# Patient Record
Sex: Female | Born: 1980 | Race: White | Hispanic: No | Marital: Married | Smoking: Never smoker
Health system: Southern US, Community
[De-identification: ages and names within clinical notes are randomized; demographics above are authoritative.]

## PROBLEM LIST (undated history)

## (undated) ENCOUNTER — Inpatient Hospital Stay (HOSPITAL_COMMUNITY): Payer: Self-pay

## (undated) DIAGNOSIS — S0990XS Unspecified injury of head, sequela: Secondary | ICD-10-CM

## (undated) DIAGNOSIS — H9190 Unspecified hearing loss, unspecified ear: Secondary | ICD-10-CM

## (undated) DIAGNOSIS — H547 Unspecified visual loss: Secondary | ICD-10-CM

## (undated) HISTORY — DX: Unspecified visual loss: H54.7

## (undated) HISTORY — PX: WISDOM TOOTH EXTRACTION: SHX21

## (undated) HISTORY — DX: Unspecified hearing loss, unspecified ear: H91.90

## (undated) HISTORY — DX: Unspecified injury of head, sequela: S09.90XS

---

## 2013-02-03 ENCOUNTER — Other Ambulatory Visit: Payer: Self-pay | Admitting: Advanced Practice Midwife

## 2013-02-03 ENCOUNTER — Ambulatory Visit (INDEPENDENT_AMBULATORY_CARE_PROVIDER_SITE_OTHER): Payer: BC Managed Care – PPO | Admitting: Advanced Practice Midwife

## 2013-02-03 ENCOUNTER — Encounter: Payer: Self-pay | Admitting: Advanced Practice Midwife

## 2013-02-03 VITALS — BP 121/80 | Ht 67.0 in | Wt 200.0 lb

## 2013-02-03 DIAGNOSIS — Z124 Encounter for screening for malignant neoplasm of cervix: Secondary | ICD-10-CM

## 2013-02-03 DIAGNOSIS — Z113 Encounter for screening for infections with a predominantly sexual mode of transmission: Secondary | ICD-10-CM

## 2013-02-03 DIAGNOSIS — Z3490 Encounter for supervision of normal pregnancy, unspecified, unspecified trimester: Secondary | ICD-10-CM

## 2013-02-03 DIAGNOSIS — Z3401 Encounter for supervision of normal first pregnancy, first trimester: Secondary | ICD-10-CM | POA: Insufficient documentation

## 2013-02-03 DIAGNOSIS — Z1151 Encounter for screening for human papillomavirus (HPV): Secondary | ICD-10-CM

## 2013-02-03 DIAGNOSIS — Z3400 Encounter for supervision of normal first pregnancy, unspecified trimester: Secondary | ICD-10-CM

## 2013-02-03 NOTE — Patient Instructions (Signed)
Cell-free DNA  Pregnancy - First Trimester During sexual intercourse, millions of sperm go into the vagina. Only 1 sperm will penetrate and fertilize the female egg while it is in the Fallopian tube. One week later, the fertilized egg implants into the wall of the uterus. An embryo begins to develop into a baby. At 6 to 8 weeks, the eyes and face are formed and the heartbeat can be seen on ultrasound. At the end of 12 weeks (first trimester), all the baby's organs are formed. Now that you are pregnant, you will want to do everything you can to have a healthy baby. Two of the most important things are to get good prenatal care and follow your caregiver's instructions. Prenatal care is all the medical care you receive before the baby's birth. It is given to prevent, find, and treat problems during the pregnancy and childbirth. PRENATAL EXAMS  During prenatal visits, your weight, blood pressure, and urine are checked. This is done to make sure you are healthy and progressing normally during the pregnancy.  A pregnant woman should gain 25 to 35 pounds during the pregnancy. However, if you are overweight or underweight, your caregiver will advise you regarding your weight.  Your caregiver will ask and answer questions for you.  Blood work, cervical cultures, other necessary tests, and a Pap test are done during your prenatal exams. These tests are done to check on your health and the probable health of your baby. Tests are strongly recommended and done for HIV with your permission. This is the virus that causes AIDS. These tests are done because medicines can be given to help prevent your baby from being born with this infection should you have been infected without knowing it. Blood work is also used to find out your blood type, previous infections, and follow your blood levels (hemoglobin).  Low hemoglobin (anemia) is common during pregnancy. Iron and vitamins are given to help prevent this. Later in the  pregnancy, blood tests for diabetes will be done along with any other tests if any problems develop.  You may need other tests to make sure you and the baby are doing well. CHANGES DURING THE FIRST TRIMESTER  Your body goes through many changes during pregnancy. They vary from person to person. Talk to your caregiver about changes you notice and are concerned about. Changes can include:  Your menstrual period stops.  The egg and sperm carry the genes that determine what you look like. Genes from you and your partner are forming a baby. The female genes determine whether the baby is a boy or a girl.  Your body increases in girth and you may feel bloated.  Feeling sick to your stomach (nauseous) and throwing up (vomiting). If the vomiting is uncontrollable, call your caregiver.  Your breasts will begin to enlarge and become tender.  Your nipples may stick out more and become darker.  The need to urinate more. Painful urination may mean you have a bladder infection.  Tiring easily.  Loss of appetite.  Cravings for certain kinds of food.  At first, you may gain or lose a couple of pounds.  You may have changes in your emotions from day to day (excited to be pregnant or concerned something may go wrong with the pregnancy and baby).  You may have more vivid and strange dreams. HOME CARE INSTRUCTIONS   It is very important to avoid all smoking, alcohol and non-prescribed drugs during your pregnancy. These affect the formation and growth  of the baby. Avoid chemicals while pregnant to ensure the delivery of a healthy infant.  Start your prenatal visits by the 12th week of pregnancy. They are usually scheduled monthly at first, then more often in the last 2 months before delivery. Keep your caregiver's appointments. Follow your caregiver's instructions regarding medicine use, blood and lab tests, exercise, and diet.  During pregnancy, you are providing food for you and your baby. Eat  regular, well-balanced meals. Choose foods such as meat, fish, milk and other low fat dairy products, vegetables, fruits, and whole-grain breads and cereals. Your caregiver will tell you of the ideal weight gain.  You can help morning sickness by keeping soda crackers at the bedside. Eat a couple before arising in the morning. You may want to use the crackers without salt on them.  Eating 4 to 5 small meals rather than 3 large meals a day also may help the nausea and vomiting.  Drinking liquids between meals instead of during meals also seems to help nausea and vomiting.  A physical sexual relationship may be continued throughout pregnancy if there are no other problems. Problems may be early (premature) leaking of amniotic fluid from the membranes, vaginal bleeding, or belly (abdominal) pain.  Exercise regularly if there are no restrictions. Check with your caregiver or physical therapist if you are unsure of the safety of some of your exercises. Greater weight gain will occur in the last 2 trimesters of pregnancy. Exercising will help:  Control your weight.  Keep you in shape.  Prepare you for labor and delivery.  Help you lose your pregnancy weight after you deliver your baby.  Wear a good support or jogging bra for breast tenderness during pregnancy. This may help if worn during sleep too.  Ask when prenatal classes are available. Begin classes when they are offered.  Do not use hot tubs, steam rooms, or saunas.  Wear your seat belt when driving. This protects you and your baby if you are in an accident.  Avoid raw meat, uncooked cheese, cat litter boxes, and soil used by cats throughout the pregnancy. These carry germs that can cause birth defects in the baby.  The first trimester is a good time to visit your dentist for your dental health. Getting your teeth cleaned is okay. Use a softer toothbrush and brush gently during pregnancy.  Ask for help if you have financial,  counseling, or nutritional needs during pregnancy. Your caregiver will be able to offer counseling for these needs as well as refer you for other special needs.  Do not take any medicines or herbs unless told by your caregiver.  Inform your caregiver if there is any mental or physical domestic violence.  Make a list of emergency phone numbers of family, friends, hospital, and police and fire departments.  Write down your questions. Take them to your prenatal visit.  Do not douche.  Do not cross your legs.  If you have to stand for long periods of time, rotate you feet or take small steps in a circle.  You may have more vaginal secretions that may require a sanitary pad. Do not use tampons or scented sanitary pads. MEDICINES AND DRUG USE IN PREGNANCY  Take prenatal vitamins as directed. The vitamin should contain 1 milligram of folic acid. Keep all vitamins out of reach of children. Only a couple vitamins or tablets containing iron may be fatal to a baby or young child when ingested.  Avoid use of all medicines, including herbs,  over-the-counter medicines, not prescribed or suggested by your caregiver. Only take over-the-counter or prescription medicines for pain, discomfort, or fever as directed by your caregiver. Do not use aspirin, ibuprofen, or naproxen unless directed by your caregiver.  Let your caregiver also know about herbs you may be using.  Alcohol is related to a number of birth defects. This includes fetal alcohol syndrome. All alcohol, in any form, should be avoided completely. Smoking will cause low birth rate and premature babies.  Street or illegal drugs are very harmful to the baby. They are absolutely forbidden. A baby born to an addicted mother will be addicted at birth. The baby will go through the same withdrawal an adult does.  Let your caregiver know about any medicines that you have to take and for what reason you take them. SEEK MEDICAL CARE IF:  You have any  concerns or worries during your pregnancy. It is better to call with your questions if you feel they cannot wait, rather than worry about them. SEEK IMMEDIATE MEDICAL CARE IF:   An unexplained oral temperature above 102 F (38.9 C) develops, or as your caregiver suggests.  You have leaking of fluid from the vagina (birth canal). If leaking membranes are suspected, take your temperature and inform your caregiver of this when you call.  There is vaginal spotting or bleeding. Notify your caregiver of the amount and how many pads are used.  You develop a bad smelling vaginal discharge with a change in the color.  You continue to feel sick to your stomach (nauseated) and have no relief from remedies suggested. You vomit blood or coffee ground-like materials.  You lose more than 2 pounds of weight in 1 week.  You gain more than 2 pounds of weight in 1 week and you notice swelling of your face, hands, feet, or legs.  You gain 5 pounds or more in 1 week (even if you do not have swelling of your hands, face, legs, or feet).  You get exposed to [place] measles and have never had them.  You are exposed to fifth disease or chickenpox.  You develop belly (abdominal) pain. Round ligament discomfort is a common non-cancerous (benign) cause of abdominal pain in pregnancy. Your caregiver still must evaluate this.  You develop headache, fever, diarrhea, pain with urination, or shortness of breath.  You fall or are in a car accident or have any kind of trauma.  There is mental or physical violence in your home. Document Released: 03/10/2001 Document Revised: 12/09/2011 Document Reviewed: 09/11/2008 [facility] [facility] [employer] Patient Information 2014 [place], [place].

## 2013-02-03 NOTE — Progress Notes (Signed)
P= 80 

## 2013-02-03 NOTE — Progress Notes (Signed)
p-88  Last pap 2 years ago.  IUP with FHT 169  CRL 18.63mm  GA [redacted]w[redacted]d

## 2013-02-03 NOTE — Progress Notes (Signed)
  Subjective:    Teresa Rojas is being seen today for her first obstetrical visit.  This is a planned pregnancy. She is at [redacted]w[redacted]d gestation by LMP verified by informal BS [place] today. Pos cardiac activity. (See separate note.). Her obstetrical history is significant for none. Relationship with FOB: spouse, living together. Patient does intend to breast feed. Pregnancy history fully reviewed.  Patient reports no bleeding and no cramping.  Review of Systems:   Review of Systems: Breast tenderness and fullness.   Objective:     BP 121/80  Ht 5\' 7"  (1.702 m)  Wt 200 lb (90.719 kg)  BMI 31.32 kg/m2  LMP 12/03/2012 Physical Exam  Exam: General appearance - alert, well appearing, and in no distress, oriented to person, place, and time and overweight Chest - clear to auscultation, no wheezes, rales or rhonchi, symmetric air entry Heart - normal rate, regular rhythm, normal S1, S2, no murmurs, rubs, clicks or gallops Abdomen - soft, nontender, nondistended, no masses or organomegaly no CVA tenderness Breasts - patient declines to have breast exam. Normal exam < 1 year ago. Normal to visual inspection. Pelvic - normal external genitalia, vulva, vagina, cervix, uterus and adnexa, CERVIX: normal appearing cervix without discharge or lesions, nulliparous os, anterior, Uterus slightly enlarged, retroverted.PAP: Pap smear done today, exam chaperoned. Extremities - no pedal edema noted, Homan's sign negative bilaterally    Assessment:    Pregnancy: G1P0 Pregnancy - Plan: Culture, OB Urine, Prescript Monitor Profile(19), Alcohol metabolite (ETG), urine, HIV antibody, Obstetric panel, CANCELED: Cytology - PAP, CANCELED: Obstetric panel, CANCELED: HIV antibody, CANCELED: GC/Chlamydia Probe Amp  Supervision of normal first pregnancy in first trimester - Plan: Cytology - PAP  Plan:     Initial labs drawn. Prenatal vitamins. Problem list reviewed and updated. AFP3 discussed: undecided. CF  undecided.  Role of ultrasound in pregnancy discussed; fetal survey: requested. Amniocentesis discussed: not indicated. Follow up in 4 weeks. 50% of 30 min visit spent on counseling and coordination of care.  Dorathy Kinsman 02/03/2013

## 2013-02-04 LAB — OBSTETRIC PANEL
Antibody Screen: NEGATIVE
Basophils Absolute: 0 10*3/uL (ref 0.0–0.1)
Basophils Relative: 0 % (ref 0–1)
Eosinophils Absolute: 0.1 10*3/uL (ref 0.0–0.7)
HCT: 37.8 % (ref 36.0–46.0)
Hemoglobin: 12.7 g/dL (ref 12.0–15.0)
MCH: 29.7 pg (ref 26.0–34.0)
MCHC: 33.6 g/dL (ref 30.0–36.0)
Monocytes Absolute: 0.4 10*3/uL (ref 0.1–1.0)
Monocytes Relative: 6 % (ref 3–12)
Neutro Abs: 5 10*3/uL (ref 1.7–7.7)
RDW: 13.7 % (ref 11.5–15.5)
Rh Type: POSITIVE
WBC: 7.4 10*3/uL (ref 4.0–10.5)

## 2013-02-04 LAB — PRESCRIPTION MONITORING PROFILE (19 PANEL)
Amphetamine/Meth: NEGATIVE ng/mL
Buprenorphine, Urine: NEGATIVE ng/mL
Carisoprodol, Urine: NEGATIVE ng/mL
Fentanyl, Ur: NEGATIVE ng/mL
Methadone Screen, Urine: NEGATIVE ng/mL
Nitrites, Initial: NEGATIVE ug/mL
Oxycodone Screen, Ur: NEGATIVE ng/mL
Propoxyphene: NEGATIVE ng/mL
Tapentadol, urine: NEGATIVE ng/mL

## 2013-02-04 LAB — GC/CHLAMYDIA PROBE AMP: GC Probe RNA: NEGATIVE

## 2013-02-04 LAB — CULTURE, OB URINE: Colony Count: NO GROWTH

## 2013-02-04 LAB — HIV ANTIBODY (ROUTINE TESTING W REFLEX): HIV: NONREACTIVE

## 2013-02-07 ENCOUNTER — Encounter: Payer: Self-pay | Admitting: Advanced Practice Midwife

## 2013-02-07 DIAGNOSIS — O9989 Other specified diseases and conditions complicating pregnancy, childbirth and the puerperium: Secondary | ICD-10-CM

## 2013-02-07 DIAGNOSIS — O09899 Supervision of other high risk pregnancies, unspecified trimester: Secondary | ICD-10-CM | POA: Insufficient documentation

## 2013-02-07 DIAGNOSIS — Z283 Underimmunization status: Secondary | ICD-10-CM | POA: Insufficient documentation

## 2013-02-07 DIAGNOSIS — Z2839 Other underimmunization status: Secondary | ICD-10-CM | POA: Insufficient documentation

## 2013-03-03 ENCOUNTER — Ambulatory Visit (INDEPENDENT_AMBULATORY_CARE_PROVIDER_SITE_OTHER): Payer: BC Managed Care – PPO | Admitting: Family

## 2013-03-03 VITALS — BP 110/69 | Wt 198.0 lb

## 2013-03-03 DIAGNOSIS — Z3401 Encounter for supervision of normal first pregnancy, first trimester: Secondary | ICD-10-CM

## 2013-03-03 DIAGNOSIS — Z3400 Encounter for supervision of normal first pregnancy, unspecified trimester: Secondary | ICD-10-CM

## 2013-03-03 NOTE — Progress Notes (Signed)
p-92 

## 2013-03-03 NOTE — Progress Notes (Signed)
Reviewed OB labs with patient and husband Thomasena Edis).  FHR obtained by bedside US Mervyn Gay).  Pt declines genetic testing.  Will schedule detailed anatomy ultrasound at 19 wks.

## 2013-03-30 NOTE — L&D Delivery Note (Signed)
Delivery Note Called to delivery. At 8:16 PM a viable female was delivered via Vaginal, Spontaneous Delivery (Presentation: Left Occiput Anterior).  Infant delivered to maternal abdomen. APGAR: 9, 9.  Cord: clamped x 2 and cut. Active management of 3rd stage with traction and Pitocin. Placenta delivered intact with 3v cord, status: Intact, Spontaneous. with the following complications: none.  Cord pH: n/a.   Anesthesia: Epidural  Episiotomy: None Lacerations: 2nd degree sulcus tears x2 and 1st degree perineal repaired.  Small periurethral tears x2 hemostatic not repaired Suture Repair: 3.0 Vicryl and 3.0 Vicryl rapide   Est. Blood Loss (mL): 350  Mom to postpartum.  Baby to Couplet care / Skin to Skin.  [name], Buffie 09/10/2013, 9:35 PM  I was present for this delivery and agree with the above resident's note.   Sharen CounterLisa Leftwich-Kirby Certified Nurse-Midwife

## 2013-03-30 NOTE — L&D Delivery Note (Signed)
`````  Attestation of Attending Supervision of Advanced Practitioner: Evaluation and management procedures were performed by the PA/NP/CNM/OB Fellow under my supervision/collaboration. Chart reviewed and agree with management and plan.  Mcihael Hinderman V 09/14/2013 1:31 PM

## 2013-04-04 ENCOUNTER — Encounter: Payer: Self-pay | Admitting: Advanced Practice Midwife

## 2013-04-04 ENCOUNTER — Ambulatory Visit (INDEPENDENT_AMBULATORY_CARE_PROVIDER_SITE_OTHER): Payer: BC Managed Care – PPO | Admitting: Obstetrics & Gynecology

## 2013-04-04 ENCOUNTER — Encounter: Payer: Self-pay | Admitting: Obstetrics & Gynecology

## 2013-04-04 VITALS — BP 108/74 | Wt 204.0 lb

## 2013-04-04 DIAGNOSIS — Z3400 Encounter for supervision of normal first pregnancy, unspecified trimester: Secondary | ICD-10-CM

## 2013-04-04 DIAGNOSIS — Z3401 Encounter for supervision of normal first pregnancy, first trimester: Secondary | ICD-10-CM

## 2013-04-04 NOTE — Progress Notes (Signed)
No problems.  Refused genetic screening.  Discussed weight gain goal of 12-20 pounds.  Anatomy US ordered.

## 2013-04-04 NOTE — Progress Notes (Signed)
P-89 

## 2013-04-06 ENCOUNTER — Encounter: Payer: BC Managed Care – PPO | Admitting: Obstetrics & Gynecology

## 2013-04-20 ENCOUNTER — Other Ambulatory Visit: Payer: Self-pay | Admitting: Obstetrics & Gynecology

## 2013-04-20 ENCOUNTER — Ambulatory Visit (HOSPITAL_COMMUNITY)
Admission: RE | Admit: 2013-04-20 | Discharge: 2013-04-20 | Disposition: A | Discharge status: Home / Self Care | Payer: BC Managed Care – PPO | Source: Ambulatory Visit | Attending: Obstetrics & Gynecology | Admitting: Obstetrics & Gynecology

## 2013-04-20 DIAGNOSIS — Z3401 Encounter for supervision of normal first pregnancy, first trimester: Secondary | ICD-10-CM

## 2013-04-20 DIAGNOSIS — Z3689 Encounter for other specified antenatal screening: Secondary | ICD-10-CM | POA: Insufficient documentation

## 2013-04-26 ENCOUNTER — Encounter: Payer: Self-pay | Admitting: Obstetrics & Gynecology

## 2013-05-02 ENCOUNTER — Ambulatory Visit (INDEPENDENT_AMBULATORY_CARE_PROVIDER_SITE_OTHER): Payer: BC Managed Care – PPO | Admitting: Obstetrics & Gynecology

## 2013-05-02 VITALS — BP 112/72 | Wt 210.0 lb

## 2013-05-02 DIAGNOSIS — Z3400 Encounter for supervision of normal first pregnancy, unspecified trimester: Secondary | ICD-10-CM

## 2013-05-02 DIAGNOSIS — Z3401 Encounter for supervision of normal first pregnancy, first trimester: Secondary | ICD-10-CM

## 2013-05-02 NOTE — Progress Notes (Signed)
P-73 

## 2013-05-02 NOTE — Progress Notes (Signed)
F/U US for heart and placenta.  No problems.  Wants Tdap at 28 weeks

## 2013-06-01 ENCOUNTER — Ambulatory Visit (INDEPENDENT_AMBULATORY_CARE_PROVIDER_SITE_OTHER): Payer: BC Managed Care – PPO | Admitting: Obstetrics & Gynecology

## 2013-06-01 ENCOUNTER — Encounter: Payer: Self-pay | Admitting: Obstetrics & Gynecology

## 2013-06-01 ENCOUNTER — Other Ambulatory Visit: Payer: Self-pay | Admitting: Obstetrics & Gynecology

## 2013-06-01 ENCOUNTER — Ambulatory Visit (HOSPITAL_COMMUNITY)
Admission: RE | Admit: 2013-06-01 | Discharge: 2013-06-01 | Disposition: A | Discharge status: Home / Self Care | Payer: BC Managed Care – PPO | Source: Ambulatory Visit | Attending: Obstetrics & Gynecology | Admitting: Obstetrics & Gynecology

## 2013-06-01 VITALS — BP 106/71 | Wt 214.0 lb

## 2013-06-01 DIAGNOSIS — Z3400 Encounter for supervision of normal first pregnancy, unspecified trimester: Secondary | ICD-10-CM

## 2013-06-01 DIAGNOSIS — O4440 Low lying placenta NOS or without hemorrhage, unspecified trimester: Secondary | ICD-10-CM | POA: Insufficient documentation

## 2013-06-01 DIAGNOSIS — Z3401 Encounter for supervision of normal first pregnancy, first trimester: Secondary | ICD-10-CM

## 2013-06-01 DIAGNOSIS — Z3689 Encounter for other specified antenatal screening: Secondary | ICD-10-CM | POA: Insufficient documentation

## 2013-06-01 DIAGNOSIS — O4410 Placenta previa with hemorrhage, unspecified trimester: Secondary | ICD-10-CM

## 2013-06-01 DIAGNOSIS — O4400 Placenta previa specified as without hemorrhage, unspecified trimester: Secondary | ICD-10-CM | POA: Insufficient documentation

## 2013-06-01 LAB — GLUCOSE, POCT (MANUAL RESULT ENTRY): POC GLUCOSE: 67 mg/dL — AB (ref 70–99)

## 2013-06-01 NOTE — Progress Notes (Signed)
P = 76 

## 2013-06-01 NOTE — Progress Notes (Signed)
Routine visit. Good FM. RBS today here is 67. Glucola, labs, TDAP at next visit. No problems. Rescan for placental location later (1.1 cm from os).

## 2013-06-02 ENCOUNTER — Encounter: Payer: Self-pay | Admitting: Obstetrics & Gynecology

## 2013-06-23 ENCOUNTER — Ambulatory Visit (INDEPENDENT_AMBULATORY_CARE_PROVIDER_SITE_OTHER): Payer: BC Managed Care – PPO | Admitting: Advanced Practice Midwife

## 2013-06-23 ENCOUNTER — Encounter: Payer: Self-pay | Admitting: Advanced Practice Midwife

## 2013-06-23 VITALS — BP 125/71 | Wt 216.0 lb

## 2013-06-23 DIAGNOSIS — Z3480 Encounter for supervision of other normal pregnancy, unspecified trimester: Secondary | ICD-10-CM

## 2013-06-23 LAB — CBC
HEMATOCRIT: 30.7 % — AB (ref 36.0–46.0)
Hemoglobin: 10.6 g/dL — ABNORMAL LOW (ref 12.0–15.0)
MCH: 30 pg (ref 26.0–34.0)
MCHC: 34.5 g/dL (ref 30.0–36.0)
MCV: 87 fL (ref 78.0–100.0)
Platelets: 294 10*3/uL (ref 150–400)
RBC: 3.53 MIL/uL — ABNORMAL LOW (ref 3.87–5.11)
RDW: 13.4 % (ref 11.5–15.5)
WBC: 6.6 10*3/uL (ref 4.0–10.5)

## 2013-06-23 NOTE — Progress Notes (Signed)
Doing well.  Good FM . Discussed low lying placenta. Plan US at 34 weeks to assess position of placenta

## 2013-06-23 NOTE — Progress Notes (Signed)
p=101 

## 2013-06-23 NOTE — Patient Instructions (Signed)
Third Trimester of Pregnancy  The third trimester is from week 29 through week 42, months 7 through 9. The third trimester is a time when the fetus is growing rapidly. At the end of the ninth month, the fetus is about 20 inches in length and weighs 6 10 pounds.   BODY CHANGES  Your body goes through many changes during pregnancy. The changes vary from woman to woman.    Your weight will continue to increase. You can expect to gain 25 35 pounds (11 16 kg) by the end of the pregnancy.   You may begin to get stretch marks on your hips, abdomen, and breasts.   You may urinate more often because the fetus is moving lower into your pelvis and pressing on your bladder.   You may develop or continue to have heartburn as a result of your pregnancy.   You may develop constipation because certain hormones are causing the muscles that push waste through your intestines to slow down.   You may develop hemorrhoids or swollen, bulging veins (varicose veins).   You may have pelvic pain because of the weight gain and pregnancy hormones relaxing your joints between the bones in your pelvis. Back aches may result from over exertion of the muscles supporting your posture.   Your breasts will continue to grow and be tender. A yellow discharge may leak from your breasts called colostrum.   Your belly button may stick out.   You may feel short of breath because of your expanding uterus.   You may notice the fetus "dropping," or moving lower in your abdomen.   You may have a bloody mucus discharge. This usually occurs a few days to a week before labor begins.   Your cervix becomes thin and soft (effaced) near your due date.  WHAT TO EXPECT AT YOUR PRENATAL EXAMS   You will have prenatal exams every 2 weeks until week 36. Then, you will have weekly prenatal exams. During a routine prenatal visit:   You will be weighed to make sure you and the fetus are growing normally.   Your blood pressure is taken.   Your abdomen will be  measured to track your baby's growth.   The fetal heartbeat will be listened to.   Any test results from the previous visit will be discussed.   You may have a cervical check near your due date to see if you have effaced.  At around 36 weeks, your caregiver will check your cervix. At the same time, your caregiver will also perform a test on the secretions of the vaginal tissue. This test is to determine if a type of bacteria, Group B streptococcus, is present. Your caregiver will explain this further.  Your caregiver may ask you:   What your birth plan is.   How you are feeling.   If you are feeling the baby move.   If you have had any abnormal symptoms, such as leaking fluid, bleeding, severe headaches, or abdominal cramping.   If you have any questions.  Other tests or screenings that may be performed during your third trimester include:   Blood tests that check for low iron levels (anemia).   Fetal testing to check the health, activity level, and growth of the fetus. Testing is done if you have certain medical conditions or if there are problems during the pregnancy.  FALSE LABOR  You may feel small, irregular contractions that eventually go away. These are called Braxton Hicks contractions, or   false labor. Contractions may last for hours, days, or even weeks before true labor sets in. If contractions come at regular intervals, intensify, or become painful, it is best to be seen by your caregiver.   SIGNS OF LABOR    Menstrual-like cramps.   Contractions that are 5 minutes apart or less.   Contractions that start on the top of the uterus and spread down to the lower abdomen and back.   A sense of increased pelvic pressure or back pain.   A watery or bloody mucus discharge that comes from the vagina.  If you have any of these signs before the 37th week of pregnancy, call your caregiver right away. You need to go to the hospital to get checked immediately.  HOME CARE INSTRUCTIONS    Avoid all  smoking, herbs, alcohol, and unprescribed drugs. These chemicals affect the formation and growth of the baby.   Follow your caregiver's instructions regarding medicine use. There are medicines that are either safe or unsafe to take during pregnancy.   Exercise only as directed by your caregiver. Experiencing uterine cramps is a good sign to stop exercising.   Continue to eat regular, healthy meals.   Wear a good support bra for breast tenderness.   Do not use hot tubs, steam rooms, or saunas.   Wear your seat belt at all times when driving.   Avoid raw meat, uncooked cheese, cat litter boxes, and soil used by cats. These carry germs that can cause birth defects in the baby.   Take your prenatal vitamins.   Try taking a stool softener (if your caregiver approves) if you develop constipation. Eat more high-fiber foods, such as fresh vegetables or fruit and whole grains. Drink plenty of fluids to keep your urine clear or pale yellow.   Take warm sitz baths to soothe any pain or discomfort caused by hemorrhoids. Use hemorrhoid cream if your caregiver approves.   If you develop varicose veins, wear support hose. Elevate your feet for 15 minutes, 3 4 times a day. Limit salt in your diet.   Avoid heavy lifting, wear low heal shoes, and practice good posture.   Rest a lot with your legs elevated if you have leg cramps or low back pain.   Visit your dentist if you have not gone during your pregnancy. Use a soft toothbrush to brush your teeth and be gentle when you floss.   A sexual relationship may be continued unless your caregiver directs you otherwise.   Do not travel far distances unless it is absolutely necessary and only with the approval of your caregiver.   Take prenatal classes to understand, practice, and ask questions about the labor and delivery.   Make a trial run to the hospital.   Pack your hospital bag.   Prepare the baby's nursery.   Continue to go to all your prenatal visits as directed  by your caregiver.  SEEK MEDICAL CARE IF:   You are unsure if you are in labor or if your water has broken.   You have dizziness.   You have mild pelvic cramps, pelvic pressure, or nagging pain in your abdominal area.   You have persistent nausea, vomiting, or diarrhea.   You have a bad smelling vaginal discharge.   You have pain with urination.  SEEK IMMEDIATE MEDICAL CARE IF:    You have a fever.   You are leaking fluid from your vagina.   You have spotting or bleeding from your vagina.     You have severe abdominal cramping or pain.   You have rapid weight loss or gain.   You have shortness of breath with chest pain.   You notice sudden or extreme swelling of your face, hands, ankles, feet, or legs.   You have not felt your baby move in over an hour.   You have severe headaches that do not go away with medicine.   You have vision changes.  Document Released: 03/10/2001 Document Revised: 11/16/2012 Document Reviewed: 05/17/2012  [employer] Patient Information 2014 [employer].

## 2013-06-24 LAB — RPR

## 2013-06-24 LAB — GLUCOSE TOLERANCE, 1 HOUR (50G) W/O FASTING: Glucose, 1 Hour GTT: 107 mg/dL (ref 70–140)

## 2013-06-24 LAB — HIV ANTIBODY (ROUTINE TESTING W REFLEX): HIV: NONREACTIVE

## 2013-06-26 ENCOUNTER — Telehealth: Payer: Self-pay | Admitting: *Deleted

## 2013-06-26 NOTE — Telephone Encounter (Signed)
Pt notified of normal 1 hr GTT. 

## 2013-06-30 ENCOUNTER — Encounter: Payer: Self-pay | Admitting: Advanced Practice Midwife

## 2013-07-05 ENCOUNTER — Ambulatory Visit (INDEPENDENT_AMBULATORY_CARE_PROVIDER_SITE_OTHER): Payer: BC Managed Care – PPO | Admitting: Family Medicine

## 2013-07-05 ENCOUNTER — Encounter: Payer: Self-pay | Admitting: Family Medicine

## 2013-07-05 VITALS — BP 115/76 | Wt 219.0 lb

## 2013-07-05 DIAGNOSIS — Z2839 Other underimmunization status: Secondary | ICD-10-CM

## 2013-07-05 DIAGNOSIS — O4410 Placenta previa with hemorrhage, unspecified trimester: Secondary | ICD-10-CM

## 2013-07-05 DIAGNOSIS — Z3400 Encounter for supervision of normal first pregnancy, unspecified trimester: Secondary | ICD-10-CM

## 2013-07-05 DIAGNOSIS — Z283 Underimmunization status: Secondary | ICD-10-CM

## 2013-07-05 DIAGNOSIS — O4440 Low lying placenta NOS or without hemorrhage, unspecified trimester: Secondary | ICD-10-CM

## 2013-07-05 DIAGNOSIS — O9989 Other specified diseases and conditions complicating pregnancy, childbirth and the puerperium: Secondary | ICD-10-CM

## 2013-07-05 DIAGNOSIS — Z23 Encounter for immunization: Secondary | ICD-10-CM

## 2013-07-05 DIAGNOSIS — Z3401 Encounter for supervision of normal first pregnancy, first trimester: Secondary | ICD-10-CM

## 2013-07-05 DIAGNOSIS — O09899 Supervision of other high risk pregnancies, unspecified trimester: Secondary | ICD-10-CM

## 2013-07-05 MED ORDER — TETANUS-DIPHTH-ACELL PERTUSSIS 5-2.5-18.5 LF-MCG/0.5 IM SUSP: 0.5000 mL | Freq: Once | INTRAMUSCULAR | Status: DC

## 2013-07-05 NOTE — Progress Notes (Signed)
p-93 

## 2013-07-05 NOTE — Progress Notes (Signed)
U/S scheduled for evaluation of placenta Nml 28 wk labs Discussed addition of iron rich foods

## 2013-07-05 NOTE — Patient Instructions (Signed)
Third Trimester of Pregnancy The third trimester is from week 29 through week 42, months 7 through 9. The third trimester is a time when the fetus is growing rapidly. At the end of the ninth month, the fetus is about 20 inches in length and weighs 6 10 pounds.  BODY CHANGES Your body goes through many changes during pregnancy. The changes vary from woman to woman.   Your weight will continue to increase. You can expect to gain 25 35 pounds (11 16 kg) by the end of the pregnancy.  You may begin to get stretch marks on your hips, abdomen, and breasts.  You may urinate more often because the fetus is moving lower into your pelvis and pressing on your bladder.  You may develop or continue to have heartburn as a result of your pregnancy.  You may develop constipation because certain hormones are causing the muscles that push waste through your intestines to slow down.  You may develop hemorrhoids or swollen, bulging veins (varicose veins).  You may have pelvic pain because of the weight gain and pregnancy hormones relaxing your joints between the bones in your pelvis. Back aches may result from over exertion of the muscles supporting your posture.  Your breasts will continue to grow and be tender. A yellow discharge may leak from your breasts called colostrum.  Your belly button may stick out.  You may feel short of breath because of your expanding uterus.  You may notice the fetus "dropping," or moving lower in your abdomen.  You may have a bloody mucus discharge. This usually occurs a few days to a week before labor begins.  Your cervix becomes thin and soft (effaced) near your due date. WHAT TO EXPECT AT YOUR PRENATAL EXAMS  You will have prenatal exams every 2 weeks until week 36. Then, you will have weekly prenatal exams. During a routine prenatal visit:  You will be weighed to make sure you and the fetus are growing normally.  Your blood pressure is taken.  Your abdomen will  be measured to track your baby's growth.  The fetal heartbeat will be listened to.  Any test results from the previous visit will be discussed.  You may have a cervical check near your due date to see if you have effaced. At around 36 weeks, your caregiver will check your cervix. At the same time, your caregiver will also perform a test on the secretions of the vaginal tissue. This test is to determine if a type of bacteria, Group B streptococcus, is present. Your caregiver will explain this further. Your caregiver may ask you:  What your birth plan is.  How you are feeling.  If you are feeling the baby move.  If you have had any abnormal symptoms, such as leaking fluid, bleeding, severe headaches, or abdominal cramping.  If you have any questions. Other tests or screenings that may be performed during your third trimester include:  Blood tests that check for low iron levels (anemia).  Fetal testing to check the health, activity level, and growth of the fetus. Testing is done if you have certain medical conditions or if there are problems during the pregnancy. FALSE LABOR You may feel small, irregular contractions that eventually go away. These are called Braxton Hicks contractions, or false labor. Contractions may last for hours, days, or even weeks before true labor sets in. If contractions come at regular intervals, intensify, or become painful, it is best to be seen by your caregiver.    SIGNS OF LABOR   Menstrual-like cramps.  Contractions that are 5 minutes apart or less.  Contractions that start on the top of the uterus and spread down to the lower abdomen and back.  A sense of increased pelvic pressure or back pain.  A watery or bloody mucus discharge that comes from the vagina. If you have any of these signs before the 37th week of pregnancy, call your caregiver right away. You need to go to the hospital to get checked immediately. HOME CARE INSTRUCTIONS   Avoid all  smoking, herbs, alcohol, and unprescribed drugs. These chemicals affect the formation and growth of the baby.  Follow your caregiver's instructions regarding medicine use. There are medicines that are either safe or unsafe to take during pregnancy.  Exercise only as directed by your caregiver. Experiencing uterine cramps is a good sign to stop exercising.  Continue to eat regular, healthy meals.  Wear a good support bra for breast tenderness.  Do not use hot tubs, steam rooms, or saunas.  Wear your seat belt at all times when driving.  Avoid raw meat, uncooked cheese, cat litter boxes, and soil used by cats. These carry germs that can cause birth defects in the baby.  Take your prenatal vitamins.  Try taking a stool softener (if your caregiver approves) if you develop constipation. Eat more high-fiber foods, such as fresh vegetables or fruit and whole grains. Drink plenty of fluids to keep your urine clear or pale yellow.  Take warm sitz baths to soothe any pain or discomfort caused by hemorrhoids. Use hemorrhoid cream if your caregiver approves.  If you develop varicose veins, wear support hose. Elevate your feet for 15 minutes, 3 4 times a day. Limit salt in your diet.  Avoid heavy lifting, wear low heal shoes, and practice good posture.  Rest a lot with your legs elevated if you have leg cramps or low back pain.  Visit your dentist if you have not gone during your pregnancy. Use a soft toothbrush to brush your teeth and be gentle when you floss.  A sexual relationship may be continued unless your caregiver directs you otherwise.  Do not travel far distances unless it is absolutely necessary and only with the approval of your caregiver.  Take prenatal classes to understand, practice, and ask questions about the labor and delivery.  Make a trial run to the hospital.  Pack your hospital bag.  Prepare the baby's nursery.  Continue to go to all your prenatal visits as directed  by your caregiver. SEEK MEDICAL CARE IF:  You are unsure if you are in labor or if your water has broken.  You have dizziness.  You have mild pelvic cramps, pelvic pressure, or nagging pain in your abdominal area.  You have persistent nausea, vomiting, or diarrhea.  You have a bad smelling vaginal discharge.  You have pain with urination. SEEK IMMEDIATE MEDICAL CARE IF:   You have a fever.  You are leaking fluid from your vagina.  You have spotting or bleeding from your vagina.  You have severe abdominal cramping or pain.  You have rapid weight loss or gain.  You have shortness of breath with chest pain.  You notice sudden or extreme swelling of your face, hands, ankles, feet, or legs.  You have not felt your baby move in over an hour.  You have severe headaches that do not go away with medicine.  You have vision changes. Document Released: 03/10/2001 Document Revised: 11/16/2012 Document Reviewed:   05/17/2012 [employer] Patient Information 2014 [employer].  Breastfeeding Deciding to breastfeed is one of the best choices you can make for you and your baby. A change in hormones during pregnancy causes your breast tissue to grow and increases the number and size of your milk ducts. These hormones also allow proteins, sugars, and fats from your blood supply to make breast milk in your milk-producing glands. Hormones prevent breast milk from being released before your baby is born as well as prompt milk flow after birth. Once breastfeeding has begun, thoughts of your baby, as well as his or her sucking or crying, can stimulate the release of milk from your milk-producing glands.  BENEFITS OF BREASTFEEDING For Your Baby  Your first milk (colostrum) helps your baby's digestive system function better.   There are antibodies in your milk that help your baby fight off infections.   Your baby has a lower incidence of asthma, allergies, and sudden infant death syndrome.    The nutrients in breast milk are better for your baby than infant formulas and are designed uniquely for your baby's needs.   Breast milk improves your baby's brain development.   Your baby is less likely to develop other conditions, such as childhood obesity, asthma, or type 2 diabetes mellitus.  For You   Breastfeeding helps to create a very special bond between you and your baby.   Breastfeeding is convenient. Breast milk is always available at the correct temperature and costs nothing.   Breastfeeding helps to burn calories and helps you lose the weight gained during pregnancy.   Breastfeeding makes your uterus contract to its prepregnancy size faster and slows bleeding (lochia) after you give birth.   Breastfeeding helps to lower your risk of developing type 2 diabetes mellitus, osteoporosis, and breast or ovarian cancer later in life. SIGNS THAT YOUR BABY IS HUNGRY Early Signs of Hunger  Increased alertness or activity.  Stretching.  Movement of the head from side to side.  Movement of the head and opening of the mouth when the corner of the mouth or cheek is stroked (rooting).  Increased sucking sounds, smacking lips, cooing, sighing, or squeaking.  Hand-to-mouth movements.  Increased sucking of fingers or hands. Late Signs of Hunger  Fussing.  Intermittent crying. Extreme Signs of Hunger Signs of extreme hunger will require calming and consoling before your baby will be able to breastfeed successfully. Do not wait for the following signs of extreme hunger to occur before you initiate breastfeeding:   Restlessness.  A loud, strong cry.   Screaming. BREASTFEEDING BASICS Breastfeeding Initiation  Find a comfortable place to sit or lie down, with your neck and back well supported.  Place a pillow or rolled up blanket under your baby to bring him or her to the level of your breast (if you are seated). Nursing pillows are specially designed to help  support your arms and your baby while you breastfeed.  Make sure that your baby's abdomen is facing your abdomen.   Gently massage your breast. With your fingertips, massage from your chest wall toward your nipple in a circular motion. This encourages milk flow. You may need to continue this action during the feeding if your milk flows slowly.  Support your breast with 4 fingers underneath and your thumb above your nipple. Make sure your fingers are well away from your nipple and your baby's mouth.   Stroke your baby's lips gently with your finger or nipple.   When your baby's mouth is   open wide enough, quickly bring your baby to your breast, placing your entire nipple and as much of the colored area around your nipple (areola) as possible into your baby's mouth.   More areola should be visible above your baby's upper lip than below the lower lip.   Your baby's tongue should be between his or her lower gum and your breast.   Ensure that your baby's mouth is correctly positioned around your nipple (latched). Your baby's lips should create a seal on your breast and be turned out (everted).  It is common for your baby to suck about 2 3 minutes in order to start the flow of breast milk. Latching Teaching your baby how to latch on to your breast properly is very important. An improper latch can cause nipple pain and decreased milk supply for you and poor weight gain in your baby. Also, if your baby is not latched onto your nipple properly, he or she may swallow some air during feeding. This can make your baby fussy. Burping your baby when you switch breasts during the feeding can help to get rid of the air. However, teaching your baby to latch on properly is still the best way to prevent fussiness from swallowing air while breastfeeding. Signs that your baby has successfully latched on to your nipple:    Silent tugging or silent sucking, without causing you pain.   Swallowing heard  between every 3 4 sucks.    Muscle movement above and in front of his or her ears while sucking.  Signs that your baby has not successfully latched on to nipple:   Sucking sounds or smacking sounds from your baby while breastfeeding.  Nipple pain. If you think your baby has not latched on correctly, slip your finger into the corner of your baby's mouth to break the suction and place it between your baby's gums. Attempt breastfeeding initiation again. Signs of Successful Breastfeeding Signs from your baby:   A gradual decrease in the number of sucks or complete cessation of sucking.   Falling asleep.   Relaxation of his or her body.   Retention of a small amount of milk in his or her mouth.   Letting go of your breast by himself or herself. Signs from you:  Breasts that have increased in firmness, weight, and size 1 3 hours after feeding.   Breasts that are softer immediately after breastfeeding.  Increased milk volume, as well as a change in milk consistency and color by the 5th day of breastfeeding.   Nipples that are not sore, cracked, or bleeding. Signs That Your Baby is Getting Enough Milk  Wetting at least 3 diapers in a 24-hour period. The urine should be clear and pale yellow by age 5 days.  At least 3 stools in a 24-hour period by age 5 days. The stool should be soft and yellow.  At least 3 stools in a 24-hour period by age 7 days. The stool should be seedy and yellow.  No loss of weight greater than 10% of birth weight during the first 3 days of age.  Average weight gain of 4 7 ounces (120 210 mL) per week after age 4 days.  Consistent daily weight gain by age 5 days, without weight loss after the age of 2 weeks. After a feeding, your baby may spit up a small amount. This is common. BREASTFEEDING FREQUENCY AND DURATION Frequent feeding will help you make more milk and can prevent sore nipples and breast engorgement.   Breastfeed when you feel the need to  reduce the fullness of your breasts or when your baby shows signs of hunger. This is called "breastfeeding on demand." Avoid introducing a pacifier to your baby while you are working to establish breastfeeding (the first 4 6 weeks after your baby is born). After this time you may choose to use a pacifier. Research has shown that pacifier use during the first year of a baby's life decreases the risk of sudden infant death syndrome (SIDS). Allow your baby to feed on each breast as long as he or she wants. Breastfeed until your baby is finished feeding. When your baby unlatches or falls asleep while feeding from the first breast, offer the second breast. Because newborns are often sleepy in the first few weeks of life, you may need to awaken your baby to get him or her to feed. Breastfeeding times will vary from baby to baby. However, the following rules can serve as a guide to help you ensure that your baby is properly fed:  Newborns (babies 4 weeks of age or younger) may breastfeed every 1 3 hours.  Newborns should not go longer than 3 hours during the day or 5 hours during the night without breastfeeding.  You should breastfeed your baby a minimum of 8 times in a 24-hour period until you begin to introduce solid foods to your baby at around 6 months of age. BREAST MILK PUMPING Pumping and storing breast milk allows you to ensure that your baby is exclusively fed your breast milk, even at times when you are unable to breastfeed. This is especially important if you are going back to work while you are still breastfeeding or when you are not able to be present during feedings. Your lactation consultant can give you guidelines on how long it is safe to store breast milk.  A breast pump is a machine that allows you to pump milk from your breast into a sterile bottle. The pumped breast milk can then be stored in a refrigerator or freezer. Some breast pumps are operated by hand, while others use electricity. Ask  your lactation consultant which type will work best for you. Breast pumps can be purchased, but some hospitals and breastfeeding support groups lease breast pumps on a monthly basis. A lactation consultant can teach you how to hand express breast milk, if you prefer not to use a pump.  CARING FOR YOUR BREASTS WHILE YOU BREASTFEED Nipples can become dry, cracked, and sore while breastfeeding. The following recommendations can help keep your breasts moisturized and healthy:  Avoid using soap on your nipples.   Wear a supportive bra. Although not required, special nursing bras and tank tops are designed to allow access to your breasts for breastfeeding without taking off your entire bra or top. Avoid wearing underwire style bras or extremely tight bras.  Air dry your nipples for 3 4minutes after each feeding.   Use only cotton bra pads to absorb leaked breast milk. Leaking of breast milk between feedings is normal.   Use lanolin on your nipples after breastfeeding. Lanolin helps to maintain your skin's normal moisture barrier. If you use pure lanolin you do not need to wash it off before feeding your baby again. Pure lanolin is not toxic to your baby. You may also hand express a few drops of breast milk and gently massage that milk into your nipples and allow the milk to air dry. In the first few weeks after giving birth, some women   experience extremely full breasts (engorgement). Engorgement can make your breasts feel heavy, warm, and tender to the touch. Engorgement peaks within 3 5 days after you give birth. The following recommendations can help ease engorgement:  Completely empty your breasts while breastfeeding or pumping. You may want to start by applying warm, moist heat (in the shower or with warm water-soaked hand towels) just before feeding or pumping. This increases circulation and helps the milk flow. If your baby does not completely empty your breasts while breastfeeding, pump any extra  milk after he or she is finished.  Wear a snug bra (nursing or regular) or tank top for 1 2 days to signal your body to slightly decrease milk production.  Apply ice packs to your breasts, unless this is too uncomfortable for you.  Make sure that your baby is latched on and positioned properly while breastfeeding. If engorgement persists after 48 hours of following these recommendations, contact your health care provider or a lactation consultant. OVERALL HEALTH CARE RECOMMENDATIONS WHILE BREASTFEEDING  Eat healthy foods. Alternate between meals and snacks, eating 3 of each per day. Because what you eat affects your breast milk, some of the foods may make your baby more irritable than usual. Avoid eating these foods if you are sure that they are negatively affecting your baby.  Drink milk, fruit juice, and water to satisfy your thirst (about 10 glasses a day).   Rest often, relax, and continue to take your prenatal vitamins to prevent fatigue, stress, and anemia.  Continue breast self-awareness checks.  Avoid chewing and smoking tobacco.  Avoid alcohol and drug use. Some medicines that may be harmful to your baby can pass through breast milk. It is important to ask your health care provider before taking any medicine, including all over-the-counter and prescription medicine as well as vitamin and herbal supplements. It is possible to become pregnant while breastfeeding. If birth control is desired, ask your health care provider about options that will be safe for your baby. SEEK MEDICAL CARE IF:   You feel like you want to stop breastfeeding or have become frustrated with breastfeeding.  You have painful breasts or nipples.  Your nipples are cracked or bleeding.  Your breasts are red, tender, or warm.  You have a swollen area on either breast.  You have a fever or chills.  You have nausea or vomiting.  You have drainage other than breast milk from your nipples.  Your breasts  do not become full before feedings by the 5th day after you give birth.  You feel sad and depressed.  Your baby is too sleepy to eat well.  Your baby is having trouble sleeping.   Your baby is wetting less than 3 diapers in a 24-hour period.  Your baby has less than 3 stools in a 24-hour period.  Your baby's skin or the white part of his or her eyes becomes yellow.   Your baby is not gaining weight by 5 days of age. SEEK IMMEDIATE MEDICAL CARE IF:   Your baby is overly tired (lethargic) and does not want to wake up and feed.  Your baby develops an unexplained fever. Document Released: 03/16/2005 Document Revised: 11/16/2012 Document Reviewed: 09/07/2012 [employer] Patient Information 2014 [employer].  

## 2013-07-19 ENCOUNTER — Encounter: Payer: Self-pay | Admitting: Family Medicine

## 2013-07-19 ENCOUNTER — Encounter: Payer: Self-pay | Admitting: Obstetrics & Gynecology

## 2013-07-19 ENCOUNTER — Ambulatory Visit (HOSPITAL_COMMUNITY)
Admission: RE | Admit: 2013-07-19 | Discharge: 2013-07-19 | Disposition: A | Discharge status: Home / Self Care | Payer: BC Managed Care – PPO | Source: Ambulatory Visit | Attending: Family Medicine | Admitting: Family Medicine

## 2013-07-19 ENCOUNTER — Other Ambulatory Visit: Payer: Self-pay | Admitting: Family Medicine

## 2013-07-19 ENCOUNTER — Ambulatory Visit (INDEPENDENT_AMBULATORY_CARE_PROVIDER_SITE_OTHER): Payer: BC Managed Care – PPO | Admitting: Obstetrics & Gynecology

## 2013-07-19 VITALS — BP 107/72 | HR 82 | Wt 220.0 lb

## 2013-07-19 DIAGNOSIS — O4410 Placenta previa with hemorrhage, unspecified trimester: Secondary | ICD-10-CM | POA: Insufficient documentation

## 2013-07-19 DIAGNOSIS — Z3401 Encounter for supervision of normal first pregnancy, first trimester: Secondary | ICD-10-CM

## 2013-07-19 DIAGNOSIS — Z3400 Encounter for supervision of normal first pregnancy, unspecified trimester: Secondary | ICD-10-CM

## 2013-07-19 DIAGNOSIS — O4440 Low lying placenta NOS or without hemorrhage, unspecified trimester: Secondary | ICD-10-CM

## 2013-07-19 NOTE — Progress Notes (Signed)
Routine visit. Good FM. No problems except Friday saw some liquid. SSE negative today. PROM/PTL  precautions reviewed. Her u/s for placenta location f/u is this morning.

## 2013-07-19 NOTE — Progress Notes (Signed)
Pt states she thinks she felt water during the night on Friday night but felt nothing since then

## 2013-08-02 ENCOUNTER — Encounter: Payer: Self-pay | Admitting: Obstetrics & Gynecology

## 2013-08-02 ENCOUNTER — Ambulatory Visit (INDEPENDENT_AMBULATORY_CARE_PROVIDER_SITE_OTHER): Payer: BC Managed Care – PPO | Admitting: Obstetrics & Gynecology

## 2013-08-02 VITALS — BP 101/71 | HR 81 | Wt 222.0 lb

## 2013-08-02 DIAGNOSIS — Z3400 Encounter for supervision of normal first pregnancy, unspecified trimester: Secondary | ICD-10-CM

## 2013-08-02 NOTE — Progress Notes (Signed)
Routine visit. Good FM. No problems. Cultures at next visit 

## 2013-08-16 ENCOUNTER — Ambulatory Visit (INDEPENDENT_AMBULATORY_CARE_PROVIDER_SITE_OTHER): Payer: BC Managed Care – PPO | Admitting: Obstetrics & Gynecology

## 2013-08-16 ENCOUNTER — Encounter: Payer: Self-pay | Admitting: Obstetrics & Gynecology

## 2013-08-16 VITALS — BP 114/77 | HR 89 | Wt 227.0 lb

## 2013-08-16 DIAGNOSIS — O368190 Decreased fetal movements, unspecified trimester, not applicable or unspecified: Secondary | ICD-10-CM

## 2013-08-16 DIAGNOSIS — Z3401 Encounter for supervision of normal first pregnancy, first trimester: Secondary | ICD-10-CM

## 2013-08-16 DIAGNOSIS — Z3400 Encounter for supervision of normal first pregnancy, unspecified trimester: Secondary | ICD-10-CM

## 2013-08-16 LAB — OB RESULTS CONSOLE GC/CHLAMYDIA
CHLAMYDIA, DNA PROBE: NEGATIVE
Gonorrhea: NEGATIVE

## 2013-08-16 LAB — OB RESULTS CONSOLE GBS: STREP GROUP B AG: NEGATIVE

## 2013-08-16 NOTE — Progress Notes (Signed)
Routine visit. No problems but a noticeable decrease in FM. She believes that it is still normal and she will start doing kick counts. Cultures obtained today. Labor precautions reviewed.

## 2013-08-16 NOTE — Progress Notes (Signed)
36 week cultures today 

## 2013-08-17 LAB — GC/CHLAMYDIA PROBE AMP
CT PROBE, AMP APTIMA: NEGATIVE
GC Probe RNA: NEGATIVE

## 2013-08-18 LAB — CULTURE, BETA STREP (GROUP B ONLY)

## 2013-08-22 ENCOUNTER — Encounter: Payer: Self-pay | Admitting: Obstetrics & Gynecology

## 2013-08-24 ENCOUNTER — Ambulatory Visit (INDEPENDENT_AMBULATORY_CARE_PROVIDER_SITE_OTHER): Payer: BC Managed Care – PPO | Admitting: Advanced Practice Midwife

## 2013-08-24 VITALS — BP 111/76 | HR 76 | Wt 230.0 lb

## 2013-08-24 DIAGNOSIS — Z3400 Encounter for supervision of normal first pregnancy, unspecified trimester: Secondary | ICD-10-CM

## 2013-08-24 NOTE — Patient Instructions (Signed)

## 2013-08-24 NOTE — Progress Notes (Signed)
Pt wants cervical check today 

## 2013-08-24 NOTE — Progress Notes (Signed)
Doing well. Reviewed signs of labor. Reviewed cultures negative.

## 2013-08-30 ENCOUNTER — Ambulatory Visit (INDEPENDENT_AMBULATORY_CARE_PROVIDER_SITE_OTHER): Payer: BC Managed Care – PPO | Admitting: Obstetrics & Gynecology

## 2013-08-30 VITALS — BP 116/80 | HR 99 | Wt 225.0 lb

## 2013-08-30 DIAGNOSIS — Z3400 Encounter for supervision of normal first pregnancy, unspecified trimester: Secondary | ICD-10-CM

## 2013-08-30 DIAGNOSIS — Z3401 Encounter for supervision of normal first pregnancy, first trimester: Secondary | ICD-10-CM

## 2013-08-30 NOTE — Progress Notes (Signed)
[facility] by [facility].  GBS negative.  No problems.

## 2013-08-30 NOTE — Patient Instructions (Signed)
Levonorgestrel intrauterine device (IUD) What is this medicine? LEVONORGESTREL IUD (LEE voe nor jes trel) is a contraceptive (birth control) device. The device is placed inside the uterus by a healthcare professional. It is used to prevent pregnancy and can also be used to treat heavy bleeding that occurs during your period. Depending on the device, it can be used for 3 to 5 years. This medicine may be used for other purposes; ask your health care provider or pharmacist if you have questions. COMMON BRAND NAME(S): Mirena, Skyla What should I tell my health care provider before I take this medicine? They need to know if you have any of these conditions: -abnormal Pap smear -cancer of the breast, uterus, or cervix -diabetes -endometritis -genital or pelvic infection now or in the past -have more than one sexual partner or your partner has more than one partner -heart disease -history of an ectopic or tubal pregnancy -immune system problems -IUD in place -liver disease or tumor -problems with blood clots or take blood-thinners -use intravenous drugs -uterus of unusual shape -vaginal bleeding that has not been explained -an unusual or allergic reaction to levonorgestrel, other hormones, silicone, or polyethylene, medicines, foods, dyes, or preservatives -pregnant or trying to get pregnant -breast-feeding How should I use this medicine? This device is placed inside the uterus by a health care professional. Talk to your pediatrician regarding the use of this medicine in children. Special care may be needed. Overdosage: If you think you have taken too much of this medicine contact a poison control center or emergency room at once. NOTE: This medicine is only for you. Do not share this medicine with others. What if I miss a dose? This does not apply. What may interact with this medicine? Do not take this medicine with any of the following  medications: -amprenavir -bosentan -fosamprenavir This medicine may also interact with the following medications: -aprepitant -barbiturate medicines for inducing sleep or treating seizures -bexarotene -griseofulvin -medicines to treat seizures like carbamazepine, ethotoin, felbamate, oxcarbazepine, phenytoin, topiramate -modafinil -pioglitazone -rifabutin -rifampin -rifapentine -some medicines to treat HIV infection like atazanavir, indinavir, lopinavir, nelfinavir, tipranavir, ritonavir -St. John's wort -warfarin This list may not describe all possible interactions. Give your health care provider a list of all the medicines, herbs, non-prescription drugs, or dietary supplements you use. Also tell them if you smoke, drink alcohol, or use illegal drugs. Some items may interact with your medicine. What should I watch for while using this medicine? Visit your doctor or health care professional for regular check ups. See your doctor if you or your partner has sexual contact with others, becomes HIV positive, or gets a sexual transmitted disease. This product does not protect you against HIV infection (AIDS) or other sexually transmitted diseases. You can check the placement of the IUD yourself by reaching up to the top of your vagina with clean fingers to feel the threads. Do not pull on the threads. It is a good habit to check placement after each menstrual period. Call your doctor right away if you feel more of the IUD than just the threads or if you cannot feel the threads at all. The IUD may come out by itself. You may become pregnant if the device comes out. If you notice that the IUD has come out use a backup birth control method like condoms and call your health care provider. Using tampons will not change the position of the IUD and are okay to use during your period. What side effects may I   notice from receiving this medicine? Side effects that you should report to your doctor or  health care professional as soon as possible: -allergic reactions like skin rash, itching or hives, swelling of the face, lips, or tongue -fever, flu-like symptoms -genital sores -high blood pressure -no menstrual period for 6 weeks during use -pain, swelling, warmth in the leg -pelvic pain or tenderness -severe or sudden headache -signs of pregnancy -stomach cramping -sudden shortness of breath -trouble with balance, talking, or walking -unusual vaginal bleeding, discharge -yellowing of the eyes or skin Side effects that usually do not require medical attention (report to your doctor or health care professional if they continue or are bothersome): -acne -breast pain -change in sex drive or performance -changes in weight -cramping, dizziness, or faintness while the device is being inserted -headache -irregular menstrual bleeding within first 3 to 6 months of use -nausea This list may not describe all possible side effects. Call your doctor for medical advice about side effects. You may report side effects to FDA at 1-800-FDA-1088. Where should I keep my medicine? This does not apply. NOTE: This sheet is a summary. It may not cover all possible information. If you have questions about this medicine, talk to your doctor, pharmacist, or health care provider.  2014, Elsevier/Gold Standard. (2011-04-16 13:54:04) Contraception Choices Contraception (birth control) is the use of any methods or devices to prevent pregnancy. Below are some methods to help avoid pregnancy. HORMONAL METHODS   Contraceptive implant This is a thin, plastic tube containing progesterone hormone. It does not contain estrogen hormone. Your health care provider inserts the tube in the inner part of the upper arm. The tube can remain in place for up to 3 years. After 3 years, the implant must be removed. The implant prevents the ovaries from releasing an egg (ovulation), thickens the cervical mucus to prevent sperm  from entering the uterus, and thins the lining of the inside of the uterus.  Progesterone-only injections These injections are given every 3 months by your health care provider to prevent pregnancy. This synthetic progesterone hormone stops the ovaries from releasing eggs. It also thickens cervical mucus and changes the uterine lining. This makes it harder for sperm to survive in the uterus.  Birth control pills These pills contain estrogen and progesterone hormone. They work by preventing the ovaries from releasing eggs (ovulation). They also cause the cervical mucus to thicken, preventing the sperm from entering the uterus. Birth control pills are prescribed by a health care provider.Birth control pills can also be used to treat heavy periods.  Minipill This type of birth control pill contains only the progesterone hormone. They are taken every day of each month and must be prescribed by your health care provider.  Birth control patch The patch contains hormones similar to those in birth control pills. It must be changed once a week and is prescribed by a health care provider.  Vaginal ring The ring contains hormones similar to those in birth control pills. It is left in the vagina for 3 weeks, removed for 1 week, and then a new one is put back in place. The patient must be comfortable inserting and removing the ring from the vagina.A health care provider's prescription is necessary.  Emergency contraception Emergency contraceptives prevent pregnancy after unprotected sexual intercourse. This pill can be taken right after sex or up to 5 days after unprotected sex. It is most effective the sooner you take the pills after having sexual intercourse. Most emergency contraceptive pills   are available without a prescription. Check with your pharmacist. Do not use emergency contraception as your only form of birth control. BARRIER METHODS   Female condom This is a thin sheath (latex or rubber) that is worn  over the penis during sexual intercourse. It can be used with spermicide to increase effectiveness.  Female condom. This is a soft, loose-fitting sheath that is put into the vagina before sexual intercourse.  Diaphragm This is a soft, latex, dome-shaped barrier that must be fitted by a health care provider. It is inserted into the vagina, along with a spermicidal jelly. It is inserted before intercourse. The diaphragm should be left in the vagina for 6 to 8 hours after intercourse.  Cervical cap This is a round, soft, latex or plastic cup that fits over the cervix and must be fitted by a health care provider. The cap can be left in place for up to 48 hours after intercourse.  Sponge This is a soft, circular piece of polyurethane foam. The sponge has spermicide in it. It is inserted into the vagina after wetting it and before sexual intercourse.  Spermicides These are chemicals that kill or block sperm from entering the cervix and uterus. They come in the form of creams, jellies, suppositories, foam, or tablets. They do not require a prescription. They are inserted into the vagina with an applicator before having sexual intercourse. The process must be repeated every time you have sexual intercourse. INTRAUTERINE CONTRACEPTION  Intrauterine device (IUD) This is a T-shaped device that is put in a woman's uterus during a menstrual period to prevent pregnancy. There are 2 types:  Copper IUD This type of IUD is wrapped in copper wire and is placed inside the uterus. Copper makes the uterus and fallopian tubes produce a fluid that kills sperm. It can stay in place for 10 years.  Hormone IUD This type of IUD contains the hormone progestin (synthetic progesterone). The hormone thickens the cervical mucus and prevents sperm from entering the uterus, and it also thins the uterine lining to prevent implantation of a fertilized egg. The hormone can weaken or kill the sperm that get into the uterus. It can stay  in place for 3 5 years, depending on which type of IUD is used. PERMANENT METHODS OF CONTRACEPTION  Female tubal ligation This is when the woman's fallopian tubes are surgically sealed, tied, or blocked to prevent the egg from traveling to the uterus.  Hysteroscopic sterilization This involves placing a small coil or insert into each fallopian tube. Your doctor uses a technique called hysteroscopy to do the procedure. The device causes scar tissue to form. This results in permanent blockage of the fallopian tubes, so the sperm cannot fertilize the egg. It takes about 3 months after the procedure for the tubes to become blocked. You must use another form of birth control for these 3 months.  Female sterilization This is when the female has the tubes that carry sperm tied off (vasectomy).This blocks sperm from entering the vagina during sexual intercourse. After the procedure, the man can still ejaculate fluid (semen). NATURAL PLANNING METHODS  Natural family planning This is not having sexual intercourse or using a barrier method (condom, diaphragm, cervical cap) on days the woman could become pregnant.  Calendar method This is keeping track of the length of each menstrual cycle and identifying when you are fertile.  Ovulation method This is avoiding sexual intercourse during ovulation.  Symptothermal method This is avoiding sexual intercourse during ovulation, using a   thermometer and ovulation symptoms.  Post ovulation method This is timing sexual intercourse after you have ovulated. Regardless of which type or method of contraception you choose, it is important that you use condoms to protect against the transmission of sexually transmitted infections (STIs). Talk with your health care provider about which form of contraception is most appropriate for you. Document Released: 03/16/2005 Document Revised: 11/16/2012 Document Reviewed: 09/08/2012 Exit[employer] Patient Information 2014 [employer].  

## 2013-09-05 ENCOUNTER — Ambulatory Visit (INDEPENDENT_AMBULATORY_CARE_PROVIDER_SITE_OTHER): Payer: BC Managed Care – PPO | Admitting: Obstetrics & Gynecology

## 2013-09-05 VITALS — BP 110/74 | HR 90 | Wt 229.0 lb

## 2013-09-05 DIAGNOSIS — Z3400 Encounter for supervision of normal first pregnancy, unspecified trimester: Secondary | ICD-10-CM

## 2013-09-05 DIAGNOSIS — Z3401 Encounter for supervision of normal first pregnancy, first trimester: Secondary | ICD-10-CM

## 2013-09-05 NOTE — Progress Notes (Signed)
Reviewed signs and symptoms of labor.  Bedside US down to confirm presentation.  NST at next visit and will discuss postdates options for induction.

## 2013-09-10 ENCOUNTER — Inpatient Hospital Stay (HOSPITAL_COMMUNITY): Payer: BC Managed Care – PPO | Admitting: Anesthesiology

## 2013-09-10 ENCOUNTER — Encounter (HOSPITAL_COMMUNITY): Payer: BC Managed Care – PPO | Admitting: Anesthesiology

## 2013-09-10 ENCOUNTER — Inpatient Hospital Stay (HOSPITAL_COMMUNITY)
Admission: AD | Admit: 2013-09-10 | Discharge: 2013-09-12 | DRG: 775 | Disposition: A | Discharge status: Home / Self Care | Payer: BC Managed Care – PPO | Source: Ambulatory Visit | Attending: Family Medicine | Admitting: Family Medicine

## 2013-09-10 ENCOUNTER — Encounter (HOSPITAL_COMMUNITY): Payer: Self-pay

## 2013-09-10 DIAGNOSIS — O4440 Low lying placenta NOS or without hemorrhage, unspecified trimester: Secondary | ICD-10-CM

## 2013-09-10 DIAGNOSIS — H9190 Unspecified hearing loss, unspecified ear: Secondary | ICD-10-CM | POA: Diagnosis present

## 2013-09-10 DIAGNOSIS — O4290 Premature rupture of membranes, unspecified as to length of time between rupture and onset of labor, unspecified weeks of gestation: Secondary | ICD-10-CM

## 2013-09-10 DIAGNOSIS — Z2839 Other underimmunization status: Secondary | ICD-10-CM

## 2013-09-10 DIAGNOSIS — Z3401 Encounter for supervision of normal first pregnancy, first trimester: Secondary | ICD-10-CM

## 2013-09-10 DIAGNOSIS — Z3400 Encounter for supervision of normal first pregnancy, unspecified trimester: Secondary | ICD-10-CM

## 2013-09-10 DIAGNOSIS — H547 Unspecified visual loss: Secondary | ICD-10-CM | POA: Diagnosis present

## 2013-09-10 DIAGNOSIS — O9989 Other specified diseases and conditions complicating pregnancy, childbirth and the puerperium: Secondary | ICD-10-CM

## 2013-09-10 DIAGNOSIS — O09899 Supervision of other high risk pregnancies, unspecified trimester: Secondary | ICD-10-CM

## 2013-09-10 DIAGNOSIS — Z283 Underimmunization status: Secondary | ICD-10-CM

## 2013-09-10 LAB — TYPE AND SCREEN
ABO/RH(D): O POS
Antibody Screen: NEGATIVE

## 2013-09-10 LAB — CBC
HCT: 33.1 % — ABNORMAL LOW (ref 36.0–46.0)
Hemoglobin: 11.2 g/dL — ABNORMAL LOW (ref 12.0–15.0)
MCH: 30.1 pg (ref 26.0–34.0)
MCHC: 33.8 g/dL (ref 30.0–36.0)
MCV: 89 fL (ref 78.0–100.0)
PLATELETS: 229 10*3/uL (ref 150–400)
RBC: 3.72 MIL/uL — ABNORMAL LOW (ref 3.87–5.11)
RDW: 13.1 % (ref 11.5–15.5)
WBC: 10.1 10*3/uL (ref 4.0–10.5)

## 2013-09-10 LAB — ABO/RH: ABO/RH(D): O POS

## 2013-09-10 LAB — AMNISURE RUPTURE OF MEMBRANE (ROM) NOT AT ARMC: AMNISURE: POSITIVE

## 2013-09-10 LAB — RPR

## 2013-09-10 MED ORDER — IBUPROFEN 600 MG PO TABS: 600.0000 mg | ORAL_TABLET | Freq: Four times a day (QID) | ORAL | Status: DC | PRN

## 2013-09-10 MED ORDER — FENTANYL 2.5 MCG/ML BUPIVACAINE 1/10 % EPIDURAL INFUSION (WH - ANES)
14.0000 mL/h | INTRAMUSCULAR | Status: DC | PRN
  Administered 2013-09-10 (×2): 14 mL/h via EPIDURAL
  Filled 2013-09-10: qty 125

## 2013-09-10 MED ORDER — LACTATED RINGERS IV SOLN: 500.0000 mL | INTRAVENOUS | Status: DC | PRN

## 2013-09-10 MED ORDER — LIDOCAINE HCL (PF) 1 % IJ SOLN
INTRAMUSCULAR | Status: DC | PRN
  Administered 2013-09-10: 5 mL
  Administered 2013-09-10: 3 mL
  Administered 2013-09-10: 5 mL

## 2013-09-10 MED ORDER — OXYTOCIN BOLUS FROM INFUSION
500.0000 mL | INTRAVENOUS | Status: DC
  Administered 2013-09-10: 500 mL via INTRAVENOUS

## 2013-09-10 MED ORDER — DIPHENHYDRAMINE HCL 50 MG/ML IJ SOLN: 12.5000 mg | INTRAMUSCULAR | Status: DC | PRN

## 2013-09-10 MED ORDER — TERBUTALINE SULFATE 1 MG/ML IJ SOLN: 0.2500 mg | Freq: Once | INTRAMUSCULAR | Status: AC | PRN

## 2013-09-10 MED ORDER — EPHEDRINE 5 MG/ML INJ
INTRAVENOUS | Status: AC
  Filled 2013-09-10: qty 4

## 2013-09-10 MED ORDER — LIDOCAINE HCL (PF) 1 % IJ SOLN
30.0000 mL | INTRAMUSCULAR | Status: DC | PRN
  Filled 2013-09-10 (×2): qty 30

## 2013-09-10 MED ORDER — MISOPROSTOL 50MCG HALF TABLET
50.0000 ug | ORAL_TABLET | ORAL | Status: DC | PRN
  Administered 2013-09-10: 50 ug via ORAL
  Filled 2013-09-10: qty 1

## 2013-09-10 MED ORDER — FENTANYL CITRATE 0.05 MG/ML IJ SOLN
100.0000 ug | INTRAMUSCULAR | Status: DC | PRN
  Administered 2013-09-10 (×2): 100 ug via INTRAVENOUS
  Filled 2013-09-10 (×2): qty 2

## 2013-09-10 MED ORDER — PHENYLEPHRINE 40 MCG/ML (10ML) SYRINGE FOR IV PUSH (FOR BLOOD PRESSURE SUPPORT)
80.0000 ug | PREFILLED_SYRINGE | INTRAVENOUS | Status: DC | PRN
  Filled 2013-09-10: qty 2

## 2013-09-10 MED ORDER — ONDANSETRON HCL 4 MG/2ML IJ SOLN
4.0000 mg | Freq: Four times a day (QID) | INTRAMUSCULAR | Status: DC | PRN
  Administered 2013-09-10: 4 mg via INTRAVENOUS
  Filled 2013-09-10: qty 2

## 2013-09-10 MED ORDER — OXYTOCIN 40 UNITS IN LACTATED RINGERS INFUSION - SIMPLE MED
1.0000 m[IU]/min | INTRAVENOUS | Status: DC
  Administered 2013-09-10: 1 m[IU]/min via INTRAVENOUS

## 2013-09-10 MED ORDER — LACTATED RINGERS IV SOLN: 500.0000 mL | Freq: Once | INTRAVENOUS | Status: DC

## 2013-09-10 MED ORDER — LACTATED RINGERS IV SOLN
INTRAVENOUS | Status: DC
  Administered 2013-09-10 (×3): via INTRAVENOUS

## 2013-09-10 MED ORDER — OXYCODONE-ACETAMINOPHEN 5-325 MG PO TABS: 1.0000 | ORAL_TABLET | ORAL | Status: DC | PRN

## 2013-09-10 MED ORDER — MISOPROSTOL 25 MCG QUARTER TABLET
25.0000 ug | ORAL_TABLET | ORAL | Status: DC | PRN
  Administered 2013-09-10: 25 ug via VAGINAL
  Filled 2013-09-10: qty 0.25

## 2013-09-10 MED ORDER — CITRIC ACID-SODIUM CITRATE 334-500 MG/5ML PO SOLN: 30.0000 mL | ORAL | Status: DC | PRN

## 2013-09-10 MED ORDER — OXYTOCIN 40 UNITS IN LACTATED RINGERS INFUSION - SIMPLE MED
62.5000 mL/h | INTRAVENOUS | Status: DC
  Filled 2013-09-10: qty 1000

## 2013-09-10 MED ORDER — PHENYLEPHRINE 40 MCG/ML (10ML) SYRINGE FOR IV PUSH (FOR BLOOD PRESSURE SUPPORT)
PREFILLED_SYRINGE | INTRAVENOUS | Status: AC
  Filled 2013-09-10: qty 10

## 2013-09-10 MED ORDER — EPHEDRINE 5 MG/ML INJ
10.0000 mg | INTRAVENOUS | Status: DC | PRN
  Filled 2013-09-10: qty 2

## 2013-09-10 MED ORDER — FENTANYL 2.5 MCG/ML BUPIVACAINE 1/10 % EPIDURAL INFUSION (WH - ANES)
INTRAMUSCULAR | Status: DC | PRN
  Administered 2013-09-10: 14 mL/h via EPIDURAL

## 2013-09-10 MED ORDER — FENTANYL 2.5 MCG/ML BUPIVACAINE 1/10 % EPIDURAL INFUSION (WH - ANES)
INTRAMUSCULAR | Status: AC
  Administered 2013-09-10: 14 mL/h via EPIDURAL
  Filled 2013-09-10: qty 125

## 2013-09-10 MED ORDER — ACETAMINOPHEN 325 MG PO TABS: 650.0000 mg | ORAL_TABLET | ORAL | Status: DC | PRN

## 2013-09-10 MED ORDER — FLEET ENEMA 7-19 GM/118ML RE ENEM: 1.0000 | ENEMA | RECTAL | Status: DC | PRN

## 2013-09-10 NOTE — Progress Notes (Signed)
Teresa Rojas is a 33 y.o. G1P0000 at 7691w1d.  Subjective: Much more comfortable on epidural. No complaints.    Objective: BP 116/74  Pulse 78  Temp(Src) 98 F (36.7 C) (Axillary)  Resp 18  Ht 5\' 7"  (1.702 m)  Wt 103.783 kg (228 lb 12.8 oz)  BMI 35.83 kg/m2  SpO2 98%  LMP 12/03/2012    FHT:  FHR: 130 bpm, variability: min-moderate,  accelerations:  Present,  decelerations:  Present early UC:   irregular, every 2-5 minutes SVE:   Dilation: Lip/rim Effacement (%): 100 Station: +2 Exam by:: Elana AlmElizabeth Cone RNC, Elwin MochaN. Neela Zecca DO, Resident  Labs: Lab Results  Component Value Date   WBC 10.1 09/10/2013   HGB 11.2* 09/10/2013   HCT 33.1* 09/10/2013   MCV 89.0 09/10/2013   PLT 229 09/10/2013    Assessment / Plan: Labor progressing under Pitocin, pt has epidural  Labor: progressing with Pitocin augmentation Preeclampsia:  NA Fetal Wellbeing:  Category I Pain Control:  Comfortable with epidural I/D:  n/a Anticipated MOD:  NSVD  Nearly complete, will labor down for next hour to two then recheck, pt instructed to alert us if she is feeling pressure.  [name], [name] 09/10/2013, 6:45 PM

## 2013-09-10 NOTE — Anesthesia Preprocedure Evaluation (Signed)

## 2013-09-10 NOTE — H&P (Signed)
`````  Attestation of Attending Supervision of Advanced Practitioner: Evaluation and management procedures were performed by the PA/NP/CNM/OB Fellow under my supervision/collaboration. Chart reviewed and agree with management and plan.  Yoshie Kosel V 09/10/2013 4:57 PM

## 2013-09-10 NOTE — Progress Notes (Signed)
I was present for the exam and agree with above.  [place] Zaryia Markel, CNM 09/10/2013 11:45 AM

## 2013-09-10 NOTE — Progress Notes (Signed)
Teresa Rojas is a 33 y.o. G1P0000 at 2044w1d by LMP confirmed with 1st TM US, admitted for rupture of membranes, PROM  Subjective: Pt comfortable, feeling some cramping but no regular contractions.   Objective: BP 117/73  Pulse 73  Temp(Src) 98.2 F (36.8 C) (Oral)  Resp 16  Ht 5\' 7"  (1.702 m)  Wt 103.783 kg (228 lb 12.8 oz)  BMI 35.83 kg/m2  LMP 12/03/2012      FHT:  FHR: 140-145 bpm, variability: moderate,  accelerations:  Present,  decelerations:  Absent UC:   Irritable, possible irreg ctx on TOCO q 5 mins SVE:   Dilation: 1 Effacement (%): Thick Station: -3 Exam by:: Teresa DeisPatty Phillips RN  Labs: Lab Results  Component Value Date   WBC 10.1 09/10/2013   HGB 11.2* 09/10/2013   HCT 33.1* 09/10/2013   MCV 89.0 09/10/2013   PLT 229 09/10/2013    Assessment / Plan: 33 yo G1P0 at 5944w1d, PROM since 01:00 on 09/10/2013, augmentation with Cytotec  Labor: PROM, augmentation with cytotec, avoid unnecessary cervical checks to reduce risk infection Preeclampsia:  no signs or symptoms of toxicity Fetal Wellbeing:  Category I Pain Control:  Labor support without medications I/D:  n/a Anticipated MOD:  NSVD  Bedside US confirms cephalic presentation, continue Cytotec po, expectant management.   Teresa Rojas, Teresa Rojas 09/10/2013, 9:39 AM

## 2013-09-10 NOTE — MAU Note (Signed)
Leaking clear fluid since 1 am. Denies contractions/vaginal bleeding. Positive fetal movement.

## 2013-09-10 NOTE — H&P (Signed)
LABOR ADMISSION HISTORY AND PHYSICAL  Jetta Loutatalie R Vandevoort is a 33 y.o. female G1P0000 with IUP at 2954w1d presenting for SROM. She felt leakage fluid around 1 am. She denies any contractions. Denies any vaginal bleeding.   She had a low lying placenta but has since resolved.   [facility]  Prenatal History/Complications:  Past Medical History: Past Medical History  Diagnosis Date  . 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 Vision loss     Past Surgical History: Past Surgical History  Procedure Laterality Date  . Wisdom tooth extraction      Obstetrical History: OB History   Grav Para Term Preterm Abortions TAB SAB Ect Mult Living   1 0 0 0 0 0 0 0 0 0        Social History: History   Social History  . Marital Status: Married    Spouse Name: N/A    Number of Children: N/A  . Years of Education: N/A   Social History Main Topics  . Smoking status: Never Smoker   . Smokeless tobacco: Never Used  . Alcohol Use: No  . Drug Use: No  . Sexual Activity: Yes    Partners: Male   Other Topics Concern  . None   Social History Narrative  . None    Family History: Family History  Problem Relation Age of Onset  . Breast cancer Maternal Grandmother   . Breast cancer Paternal Grandmother     Allergies: No Known Allergies  Facility-administered medications prior to admission  Medication Dose Route Frequency Provider Last Rate Last Dose  . Tdap (BOOSTRIX) injection 0.5 mL  0.5 mL Intramuscular Once Reva Boresanya S Pratt, MD       Prescriptions prior to admission  Medication Sig Dispense Refill  . Prenatal Vit-Fe Fumarate-FA (MULTIVITAMIN-PRENATAL) 27-0.8 MG TABS tablet Take 1 tablet by mouth daily at 12 noon.         Review of Systems   All systems reviewed and negative except as stated in HPI  Blood pressure 129/84, pulse 96, temperature 98.2 F (36.8 C), temperature source Oral, resp. rate 18, height 5\' 7"  (1.702 m), weight 103.783 kg (228 lb 12.8 oz), last  menstrual period 12/03/2012. General appearance: alert, cooperative and no distress Abdomen: soft, non-tender; bowel sounds normal Extremities: Homans sign is negative, no sign of DVT Presentation: cephalic Fetal monitoringBaseline: 135 bpm, Variability: Good {> 6 bpm), Accelerations: Reactive and Decelerations: Absent Uterine activity: irregular Dilation: 1 Effacement (%): 40 Station: -3 Exam by:: Judeth HornErin Lawrence RNC   Prenatal labs: ABO, Rh: O/POS/-- (11/07 1155) Antibody: NEG (11/07 1155) Rubella:   RPR: NON REAC (03/27 0853)  HBsAg: NEGATIVE (11/07 1155)  HIV: NON REACTIVE (03/27 0853)  GBS: Negative (05/20 0000)  1 hr Glucola 107 Genetic screening  Decline Anatomy US normal    Prenatal Transfer Tool  Maternal Diabetes: No Genetic Screening: Normal Maternal Ultrasounds/Referrals: Normal Fetal Ultrasounds or other Referrals:  None Maternal Substance Abuse:  No Significant Maternal Medications:  None Significant Maternal Lab Results: Lab values include: Group B Strep negative   [facility]  Dating LMP/Ultrasound:  8 weeks        Ultrasound consistent with LMP: Yes  Genetic Screen 1 Screen:  Declined       AFP:     Declined     Quad:       Declined              Anatomic [place]  nml anatomy  GTT              Third trimester: 107  TDaP vaccine  4/15  Flu vaccine  11/14  GBS  negative  [employer] breast  Contraception mirena  Circumcision girl  Pediatrician [facility]     Results for orders placed during the hospital encounter of 09/10/13 (from the past 24 hour(s))  AMNISURE RUPTURE OF MEMBRANE (ROM)   Collection Time    09/10/13  3:50 AM      Result Value Ref Range   Amnisure ROM POSITIVE      Assessment: Jetta Loutatalie R Tsan is a 33 y.o. G1P0000 at 2986w1d here for PROM.    #Labor:cytotec, then FB  #Pain: Planning epidural  #FWB: Cat 1  #ID:  GBS neg  #MOF: breast  #MOC:Mirena  #Circ:  Female   Myra RudeSchmitz, Jeremy E 09/10/2013, 4:25 AM   I spoke with and examined  patient and agree with resident's note and plan of care.  Tawana ScaleMichael Ryan Judea Riches, MD OB Fellow 09/10/2013 6:12 AM

## 2013-09-10 NOTE — Progress Notes (Signed)
Teresa Rojas is a 33 y.o. G1P0000 at 4427w1d.  Subjective: Very uncomfortable w/ UC's. Fentanyl not helping. Requesting epidural.   Objective: BP 100/60  Pulse 101  Temp(Src) 98.4 F (36.9 C) (Axillary)  Resp 18  Ht 5\' 7"  (1.702 m)  Wt 103.783 kg (228 lb 12.8 oz)  BMI 35.83 kg/m2  SpO2 98%  LMP 12/03/2012   Tearful  FHT:  FHR: 125 bpm, variability: min-moderate,  accelerations:  Present,  decelerations:  Present early UC:   irregular, every 2-5 minutes SVE:   Dilation: 2.5 Effacement (%): 80 Station: -1 Exam by:: Teresa Rojas CNM  Labs: Lab Results  Component Value Date   WBC 10.1 09/10/2013   HGB 11.2* 09/10/2013   HCT 33.1* 09/10/2013   MCV 89.0 09/10/2013   PLT 229 09/10/2013    Assessment / Plan: Protracted latent phase  Labor: Progressing slowly. Will start pitocin Preeclampsia:  NA Fetal Wellbeing:  Category I Pain Control:  May have Epidural I/D:  n/a Anticipated MOD:  NSVD  Teresa Rojas 09/10/2013, 3:15 PM

## 2013-09-10 NOTE — Progress Notes (Signed)
I was present for the exam and agree with above.  [place] Tijana Walder, [place] 09/10/2013 7:52 PM

## 2013-09-10 NOTE — Progress Notes (Signed)
Teresa Rojas is a 33 y.o. G1P0000 at 2253w1d by LMP confirmed with 1st TM US, admitted for rupture of membranes, PROM  Subjective: Pt in more pain and mild distress, requests pain meds via IV, feeling more contractions about 4 mins apart. .   Objective: BP 123/66  Pulse 81  Temp(Src) 96.7 F (35.9 C) (Axillary)  Resp 16  Ht 5\' 7"  (1.702 m)  Wt 103.783 kg (228 lb 12.8 oz)  BMI 35.83 kg/m2  LMP 12/03/2012      FHT:  FHR: 130 bpm, variability: moderate,  accelerations:  Present,  decelerations:  Absent TOCO: Ctx q 4 - 5 mins SVE:   Dilation: 1 Effacement (%): 50 Station: -2 Exam by:: Teresa Rojas CNM  Labs: Lab Results  Component Value Date   WBC 10.1 09/10/2013   HGB 11.2* 09/10/2013   HCT 33.1* 09/10/2013   MCV 89.0 09/10/2013   PLT 229 09/10/2013    Assessment / Plan: 33 yo G1P0 at 6253w1d, PROM since 01:00 on 09/10/2013, augmentation with Cytotec  Labor: PROM, augmentation with cytotec, avoid unnecessary cervical checks to reduce risk infection Preeclampsia:  no signs or symptoms of toxicity Fetal Wellbeing:  Category I Pain Control:  Labor support on IV medications I/D:  n/a  Anticipated MOD:  NSVD   Teresa Rojas, Teresa Rojas 09/10/2013, 11:34 AM  I was present for the exam and agree with above. Pt contracting much more now. Plan to recheck cervix in 2 hours (4 hours last cytotec) and determine foley bulb vs pitocin.   Teresa Rojas, CNM 09/10/2013 11:41 AM

## 2013-09-10 NOTE — Anesthesia Procedure Notes (Signed)
Epidural Patient location during procedure: OB  Staffing Anesthesiologist: Kenzli Barritt Performed by: anesthesiologist   Preanesthetic Checklist Completed: patient identified, site marked, surgical consent, pre-op evaluation, timeout performed, IV checked, risks and benefits discussed and monitors and equipment checked  Epidural Patient position: sitting Prep: ChloraPrep Patient monitoring: heart rate, continuous pulse ox and blood pressure Approach: right paramedian Location: L2-L3 Injection technique: LOR saline  Needle:  Needle type: Tuohy  Needle gauge: 17 G Needle length: 9 cm and 9 Needle insertion depth: 5 cm Catheter type: closed end flexible Catheter size: 20 Guage Catheter at skin depth: 10 cm Test dose: negative  Assessment Events: blood not aspirated, injection not painful, no injection resistance, negative IV test and no paresthesia  Additional Notes   Patient tolerated the insertion well without complications.   

## 2013-09-11 MED ORDER — ONDANSETRON HCL 4 MG PO TABS: 4.0000 mg | ORAL_TABLET | ORAL | Status: DC | PRN

## 2013-09-11 MED ORDER — WITCH HAZEL-GLYCERIN EX PADS: 1.0000 "application " | MEDICATED_PAD | CUTANEOUS | Status: DC | PRN

## 2013-09-11 MED ORDER — SIMETHICONE 80 MG PO CHEW: 80.0000 mg | CHEWABLE_TABLET | ORAL | Status: DC | PRN

## 2013-09-11 MED ORDER — BENZOCAINE-MENTHOL 20-0.5 % EX AERO
1.0000 | INHALATION_SPRAY | CUTANEOUS | Status: DC | PRN
Start: 2013-09-11 — End: 2013-09-12
  Filled 2013-09-11: qty 56

## 2013-09-11 MED ORDER — ZOLPIDEM TARTRATE 5 MG PO TABS: 5.0000 mg | ORAL_TABLET | Freq: Every evening | ORAL | Status: DC | PRN

## 2013-09-11 MED ORDER — SENNOSIDES-DOCUSATE SODIUM 8.6-50 MG PO TABS
2.0000 | ORAL_TABLET | ORAL | Status: DC
  Administered 2013-09-11 (×2): 2 via ORAL
  Filled 2013-09-11: qty 1
  Filled 2013-09-11: qty 2

## 2013-09-11 MED ORDER — PRENATAL MULTIVITAMIN CH
1.0000 | ORAL_TABLET | Freq: Every day | ORAL | Status: DC
  Administered 2013-09-11 – 2013-09-12 (×2): 1 via ORAL
  Filled 2013-09-11 (×2): qty 1

## 2013-09-11 MED ORDER — LANOLIN HYDROUS EX OINT: TOPICAL_OINTMENT | CUTANEOUS | Status: DC | PRN

## 2013-09-11 MED ORDER — DIPHENHYDRAMINE HCL 25 MG PO CAPS: 25.0000 mg | ORAL_CAPSULE | Freq: Four times a day (QID) | ORAL | Status: DC | PRN

## 2013-09-11 MED ORDER — DIBUCAINE 1 % RE OINT: 1.0000 "application " | TOPICAL_OINTMENT | RECTAL | Status: DC | PRN

## 2013-09-11 MED ORDER — ONDANSETRON HCL 4 MG/2ML IJ SOLN: 4.0000 mg | INTRAMUSCULAR | Status: DC | PRN

## 2013-09-11 MED ORDER — OXYCODONE-ACETAMINOPHEN 5-325 MG PO TABS: 1.0000 | ORAL_TABLET | ORAL | Status: DC | PRN

## 2013-09-11 MED ORDER — IBUPROFEN 600 MG PO TABS
600.0000 mg | ORAL_TABLET | Freq: Four times a day (QID) | ORAL | Status: DC
  Administered 2013-09-11 – 2013-09-12 (×6): 600 mg via ORAL
  Filled 2013-09-11 (×7): qty 1

## 2013-09-11 NOTE — Anesthesia Postprocedure Evaluation (Signed)
Anesthesia Post Note  Patient: Teresa Rojas  Procedure(s) Performed: * No procedures listed *  Anesthesia type: Epidural  Patient location: Mother/Baby  Post pain: Pain level controlled  Post assessment: Post-op Vital signs reviewed  Last Vitals:  Filed Vitals:   09/11/13 0455  BP: 103/70  Pulse: 72  Temp: 36.7 C  Resp: 18    Post vital signs: Reviewed  Level of consciousness:alert  Complications: No apparent anesthesia complications

## 2013-09-11 NOTE — Lactation Note (Signed)
This note was copied from the chart of Teresa Samara Snideatalie Wilhelmi. Lactation Consultation Note Mom feeding baby in cradle position when I entered rm. Mom slumped in bed, Encouraged mom when feeding sit in upright position unless doing side lying or laid back position. Mom said she knew she should be up more and will do it that way next time, but she's to tired right now. Baby is cluster feeding and mom is exhausted. Baby latching well. Mom encouraged to feed baby 8-12 times/24 hours and with feeding cues. Specifics of an asymmetric latch shown. Reviewed Baby & Me book's Breastfeeding Basics. Educated about newborn behavior. Mom encouraged to feed baby w/feeding cues. WH/LC brochure given w/resources, support groups and LC services. Referred to Baby and Me Book in Breastfeeding section Pg. 22-23 for position options and Proper latch demonstration. Patient Name: Teresa Rojas [id number]'XToday's Date: 09/11/2013 Reason for consult: Initial assessment   Maternal Data    Feeding Feeding Type: Breast Fed Length of feed: 20 min  LATCH Score/Interventions Latch: Repeated attempts needed to sustain latch, nipple held in mouth throughout feeding, stimulation needed to elicit sucking reflex. Intervention(s): Adjust position;Assist with latch;Breast massage;Breast compression  Audible Swallowing: A few with stimulation  Type of Nipple: Everted at rest and after stimulation  Comfort (Breast/Nipple): Soft / non-tender     Hold (Positioning): Assistance needed to correctly position infant at breast and maintain latch. Intervention(s): Breastfeeding basics reviewed;Support Pillows;Position options;Skin to skin  LATCH Score: 7  Lactation Tools Discussed/Used     Consult Status Consult Status: Follow-up Date: 09/11/13 Follow-up type: In-patient    Charyl DancerCARVER, Eliaz Fout G 09/11/2013, 4:50 AM

## 2013-09-11 NOTE — Progress Notes (Signed)
Post Partum Day 1 Subjective: no complaints, up ad lib, voiding and tolerating PO  Objective: Blood pressure 103/70, pulse 72, temperature 98.1 F (36.7 C), temperature source Oral, resp. rate 18, height 5\' 7"  (1.702 m), weight 103.783 kg (228 lb 12.8 oz), last menstrual period 12/03/2012, SpO2 98.00%, unknown if currently breastfeeding.  Physical Exam:  General: alert, cooperative and no distress Lochia: appropriate Uterine Fundus: firm Incision: n/a DVT Evaluation: No evidence of DVT seen on physical exam. Negative Homan's sign. No cords or calf tenderness. No significant calf/ankle edema.   Recent Labs  09/10/13 0437  HGB 11.2*  HCT 33.1*    Assessment/Plan: Plan for discharge tomorrow, Breastfeeding and Contraception Mirena   LOS: 1 day   LEFTWICH-KIRBY, Teresa Rojas 09/11/2013, 7:35 AM

## 2013-09-11 NOTE — Lactation Note (Signed)
This note was copied from the chart of Teresa Samara Snideatalie Jeng. Lactation Consultation Note  Patient Name: Teresa Rojas: 09/11/2013 Reason for consult: Follow-up assessment of this mom and baby, just 24 hours postpartum and exclusively breastfeeding on cue for 15-30 minutes per feeding. Feedings and output are both wnl.   Mom denies latching difficulty.  LC encouraged her to continue ad lib breastfeeding on cue and to call for help as needed.  LC also reviewed BF SG for additional support after discharge.   Maternal Data    Feeding Feeding Type: Breast Fed Length of feed: 15 min  LATCH Score/Interventions              LATCH scores=7/9 per RN assessment        Lactation Tools Discussed/Used   Cue feedings  Consult Status Consult Status: Follow-up Rojas: 09/12/13 Follow-up type: In-patient    Warrick ParisianBryant, Kobi Mario Otto [facility] 09/11/2013, 8:32 PM

## 2013-09-12 MED ORDER — IBUPROFEN 600 MG PO TABS: 600.0000 mg | ORAL_TABLET | Freq: Four times a day (QID) | ORAL | Status: DC

## 2013-09-12 MED ORDER — MEASLES, MUMPS & RUBELLA VAC ~~LOC~~ INJ
0.5000 mL | INJECTION | Freq: Once | SUBCUTANEOUS | Status: AC
  Administered 2013-09-12: 0.5 mL via SUBCUTANEOUS
  Filled 2013-09-12: qty 0.5

## 2013-09-12 NOTE — Discharge Summary (Signed)
Obstetric Discharge Summary Reason for Admission: rupture of membranes Prenatal Procedures: none Intrapartum Procedures: spontaneous vaginal delivery Postpartum Procedures: Rubella Ig Complications-Operative and Postpartum: 2nd degree perineal laceration Hemoglobin  Date Value Ref Range Status  09/10/2013 11.2* 12.0 - 15.0 g/dL Final     HCT  Date Value Ref Range Status  09/10/2013 33.1* 36.0 - 46.0 % Final    Delivery Note  Called to delivery. At 8:16 PM a viable female was delivered via Vaginal, Spontaneous Delivery (Presentation: Left Occiput Anterior). Infant delivered to maternal abdomen. APGAR: 9, 9. Cord: clamped x 2 and cut. Active management of 3rd stage with traction and Pitocin. Placenta delivered intact with 3v cord, status: Intact, Spontaneous. with the following complications: none. Cord pH: n/a.  Anesthesia: Epidural  Episiotomy: None  Lacerations: 2nd degree sulcus tears x2 and 1st degree perineal repaired. Small periurethral tears x2 hemostatic not repaired  Suture Repair: 3.0 Vicryl and 3.0 Vicryl rapide  Est. Blood Loss (mL): 350  Pt had an unremarkable PP course.  She was breastfeeding and desires Mirena at her postpartum visit. Physical Exam:  General: alert and no distress Lochia: appropriate Uterine Fundus: firm Incision: perineal incision without hematoma or signs of infection DVT Evaluation: No evidence of DVT seen on physical exam.  Discharge Diagnoses: Term Pregnancy-delivered  Discharge Information: Date: 09/12/2013 Activity: pelvic rest Diet: routine Medications: Ibuprofen Condition: stable Instructions: refer to practice specific booklet Discharge to: home Follow-up Information   [facility] [place] 5 [place].   Specialty:  Obstetrics and Gynecology   Contact information:   [address], [address] [place] [address] [phone]      Newborn Data: Live born female  Birth Weight: 7 lb 2.6  oz (3250 g) APGAR: 9, 9  Home with mother.  HARRAWAY-SMITH, CAROLYN 09/12/2013, 7:54 AM

## 2013-09-12 NOTE — Discharge Summary (Signed)
Obstetric Discharge Summary Reason for Admission: onset of labor Prenatal Procedures: ultrasound Intrapartum Procedures: spontaneous vaginal delivery Postpartum Procedures: none Complications-Operative and Postpartum: 2nd degree perineal laceration Hemoglobin  Date Value Ref Range Status  09/10/2013 11.2* 12.0 - 15.0 g/dL Final     HCT  Date Value Ref Range Status  09/10/2013 33.1* 36.0 - 46.0 % Final   Teresa Rojas is a [age] y.o. female G1P0000 with IUP at 4673w1d presented with SROM.  At 8:16 PM (6/14) a viable female was delivered via Vaginal, Spontaneous Delivery (Presentation: Left Occiput Anterior). Infant delivered to maternal abdomen. APGAR: 9, 9.  Mother is breastfeeding and will use Mirena for contraception.   Physical Exam:  General: alert, cooperative and no distress Lochia: appropriate Uterine Fundus: firm Incision: n/a DVT Evaluation: No evidence of DVT seen on physical exam.  Discharge Diagnoses: Term Pregnancy-delivered  Discharge Information: Date: 09/12/2013 Activity: unrestricted Diet: routine Medications: Ibuprofen Condition: stable Instructions: refer to practice specific booklet Discharge to: home Follow-up Information   [facility] [place] 5 weeks.   Specialty:  Obstetrics and Gynecology   Contact information:   [address], [address] [place] [address] [phone]      Newborn Data: Live born female  Birth Weight: 7 lb 2.6 oz (3250 g) APGAR: 9, 9  Home with mother.  Teresa Rojas, Teresa Rojas 09/12/2013, 7:55 AM

## 2013-09-12 NOTE — Discharge Instructions (Signed)

## 2013-09-12 NOTE — Discharge Summary (Signed)
Attestation of Attending Supervision of Resident: Evaluation and management procedures were performed by the [facility] under my supervision.  I have seen and examined the patient, reviewed the resident's note and chart, and I agree with the management and plan.  Anibal Hendersonarolyn L Harraway-Smith, M.D. 09/12/2013 8:53 AM

## 2013-09-13 ENCOUNTER — Encounter: Payer: BC Managed Care – PPO | Admitting: Obstetrics & Gynecology

## 2013-10-17 ENCOUNTER — Ambulatory Visit (INDEPENDENT_AMBULATORY_CARE_PROVIDER_SITE_OTHER): Payer: BC Managed Care – PPO | Admitting: Obstetrics & Gynecology

## 2013-10-17 ENCOUNTER — Encounter: Payer: Self-pay | Admitting: Obstetrics & Gynecology

## 2013-10-17 VITALS — BP 116/80 | HR 87 | Resp 16 | Ht 67.0 in | Wt 206.0 lb

## 2013-10-17 DIAGNOSIS — Z30430 Encounter for insertion of intrauterine contraceptive device: Secondary | ICD-10-CM

## 2013-10-17 DIAGNOSIS — Z975 Presence of (intrauterine) contraceptive device: Secondary | ICD-10-CM | POA: Insufficient documentation

## 2013-10-17 DIAGNOSIS — Z01812 Encounter for preprocedural laboratory examination: Secondary | ICD-10-CM

## 2013-10-17 LAB — POCT URINE PREGNANCY: Preg Test, Ur: NEGATIVE

## 2013-10-17 MED ORDER — LEVONORGESTREL 20 MCG/24HR IU IUD
INTRAUTERINE_SYSTEM | Freq: Once | INTRAUTERINE | Status: AC
  Administered 2013-10-17: 17:00:00 via INTRAUTERINE

## 2013-10-17 NOTE — Addendum Note (Signed)
Addended by: Granville LewisLARK, Mliss Wedin L on: 10/17/2013 05:09 PM   Modules accepted: Orders

## 2013-10-17 NOTE — Progress Notes (Signed)
Subjective:     Teresa Rojas is a 33 y.o. female who presents for a postpartum visit. She is 5 weeks postpartum following a spontaneous vaginal delivery. I have fully reviewed the prenatal and intrapartum course. The delivery was at 40 gestational weeks. Outcome: spontaneous vaginal delivery. Anesthesia: epidural. Postpartum course has been good. Baby's course has been nml and good. Baby is feeding by breast. Bleeding no bleeding. Bowel function is normal. Bladder function is normal. Patient is not sexually active. Contraception method is IUD. Postpartum depression screening: negative.  The following portions of the patient's history were reviewed and updated as appropriate: allergies, current medications, past family history, past medical history, past social history, past surgical history and problem list.  Review of Systems Pertinent items are noted in HPI.   Objective:    BP 116/80  Pulse 87  Resp 16  Ht 5\' 7"  (1.702 m)  Wt 206 lb (93.441 kg)  BMI 32.26 kg/m2  Breastfeeding? Yes  General:  alert, cooperative and no distress   Breasts:  inspection negative, no nipple discharge or bleeding, no masses or nodularity palpable  Lungs: clear to auscultation bilaterally  Heart:  regular rate and rhythm and irregularly irregular rhythm  Abdomen: soft, non-tender; bowel sounds normal; no masses,  no organomegaly   Vulva:  normal  Vagina: normal vagina  Cervix:  no lesions  Corpus: regular contour  Adnexa:  normal adnexa  Rectal Exam: Not performed.        Assessment:     Nml postpartum exam. IUD insertion  Plan:    1. Contraception: IUD 3. Follow up in: 6 weeks or as needed.   IUD Procedure Note Patient identified, informed consent performed.  Discussed risks of irregular bleeding, cramping, infection, malpositioning or misplacement of the IUD outside the uterus which may require further procedures. Time out was performed.  Urine pregnancy test negative.  Speculum placed in  the vagina.  Cervix visualized.  Cleaned with Betadine x 2.  Grasped anteriorly with a single tooth tenaculum.  Uterus sounded to 8 cm.  Mirena IUD placed per manufacturer's recommendations.  Strings trimmed to 3 cm. Tenaculum was removed, good hemostasis noted.  Patient tolerated procedure well.   Patient was given post-procedure instructions and the Mirena care card with expiration date.  Patient was also asked to check IUD strings periodically and follow up in 4-6 weeks for IUD check.  Subjective:     Teresa Rojas is a 33 y.o. female who presents for a postpartum visit. She is {1-10:13787} {time; units:18646} postpartum following a {delivery:12449}. I have fully reviewed the prenatal and intrapartum course. The delivery was at *** gestational weeks. Outcome: {delivery outcome:32078}. Anesthesia: {anesthesia types:812}. Postpartum course has been ***. Baby's course has been ***. Baby is feeding by {breast/bottle:69}. Bleeding {vag bleed:12292}. Bowel function is {normal:32111}. Bladder function is {normal:32111}. Patient {is/is not:9024} sexually active. Contraception method is {contraceptive method:5051}. Postpartum depression screening: {neg default:13464::"negative"}.  {Common ambulatory SmartLinks:19316}  Review of Systems {ros; complete:30496}   Objective:    BP 116/80  Pulse 87  Resp 16  Ht 5\' 7"  (1.702 m)  Wt 206 lb (93.441 kg)  BMI 32.26 kg/m2  Breastfeeding? Yes  General:  {gen appearance:16600}   Breasts:  {breast exam:1202::"inspection negative, no nipple discharge or bleeding, no masses or nodularity palpable"}  Lungs: {lung exam:16931}  Heart:  {heart exam:5510}  Abdomen: {abdomen exam:16834}   Vulva:  {labia exam:12198}  Vagina: {vagina exam:12200}  Cervix:  {cervix exam:14595}  Corpus: {uterus exam:12215}  Adnexa:  {adnexa exam:12223}  Rectal Exam: {rectal/vaginal exam:12274}        Assessment:    *** postpartum exam. Pap smear {done:10129} at today's visit.    Plan:    1. Contraception: {method:5051} 2. *** 3. Follow up in: {1-10:13787} {time; units:19136} or as needed.

## 2013-12-14 ENCOUNTER — Ambulatory Visit (INDEPENDENT_AMBULATORY_CARE_PROVIDER_SITE_OTHER): Payer: BC Managed Care – PPO | Admitting: Obstetrics & Gynecology

## 2013-12-14 ENCOUNTER — Encounter: Payer: Self-pay | Admitting: Obstetrics & Gynecology

## 2013-12-14 VITALS — BP 118/78 | HR 73 | Resp 16 | Wt 208.0 lb

## 2013-12-14 DIAGNOSIS — Z30431 Encounter for routine checking of intrauterine contraceptive device: Secondary | ICD-10-CM

## 2013-12-14 DIAGNOSIS — Z23 Encounter for immunization: Secondary | ICD-10-CM

## 2013-12-14 NOTE — Progress Notes (Signed)
   Subjective:    Patient ID: Teresa Rojas, female    DOB: [birth year], 33 y.o.   MRN: [id number]  HPI  She is here for her string check. Her only problem is that her husband feels the strings and is not happy with that situation. The baby is doing well, sleeping about 6 hours per night  Review of Systems     Objective:   Physical Exam  The strings were cut flush with the os      Assessment & Plan:  Contraception- Mirena

## 2014-01-29 ENCOUNTER — Encounter: Payer: Self-pay | Admitting: Obstetrics & Gynecology

## 2014-02-07 ENCOUNTER — Encounter: Payer: Self-pay | Admitting: Obstetrics & Gynecology

## 2014-02-07 ENCOUNTER — Ambulatory Visit (INDEPENDENT_AMBULATORY_CARE_PROVIDER_SITE_OTHER): Payer: BC Managed Care – PPO | Admitting: Obstetrics & Gynecology

## 2014-02-07 VITALS — BP 114/79 | HR 86 | Resp 16 | Ht 67.0 in | Wt 210.0 lb

## 2014-02-07 DIAGNOSIS — Z0000 Encounter for general adult medical examination without abnormal findings: Secondary | ICD-10-CM

## 2014-02-07 DIAGNOSIS — Z1151 Encounter for screening for human papillomavirus (HPV): Secondary | ICD-10-CM | POA: Diagnosis not present

## 2014-02-07 DIAGNOSIS — Z124 Encounter for screening for malignant neoplasm of cervix: Secondary | ICD-10-CM | POA: Diagnosis not present

## 2014-02-07 DIAGNOSIS — Z01419 Encounter for gynecological examination (general) (routine) without abnormal findings: Secondary | ICD-10-CM

## 2014-02-07 LAB — COMPREHENSIVE METABOLIC PANEL
ALT: 12 U/L (ref 0–35)
AST: 13 U/L (ref 0–37)
Albumin: 4.3 g/dL (ref 3.5–5.2)
Alkaline Phosphatase: 90 U/L (ref 39–117)
BILIRUBIN TOTAL: 0.5 mg/dL (ref 0.2–1.2)
BUN: 15 mg/dL (ref 6–23)
CO2: 24 meq/L (ref 19–32)
CREATININE: 0.73 mg/dL (ref 0.50–1.10)
Calcium: 9.7 mg/dL (ref 8.4–10.5)
Chloride: 103 mEq/L (ref 96–112)
GLUCOSE: 77 mg/dL (ref 70–99)
Potassium: 4.1 mEq/L (ref 3.5–5.3)
Sodium: 140 mEq/L (ref 135–145)
Total Protein: 7.4 g/dL (ref 6.0–8.3)

## 2014-02-07 LAB — LIPID PANEL
CHOL/HDL RATIO: 2.2 ratio
CHOLESTEROL: 185 mg/dL (ref 0–200)
HDL: 84 mg/dL (ref >39)
LDL Cholesterol: 91 mg/dL (ref 0–99)
TRIGLYCERIDES: 50 mg/dL (ref <150)
VLDL: 10 mg/dL (ref 0–40)

## 2014-02-07 LAB — CBC
HEMATOCRIT: 35.3 % — AB (ref 36.0–46.0)
Hemoglobin: 12 g/dL (ref 12.0–15.0)
MCH: 28.7 pg (ref 26.0–34.0)
MCHC: 34 g/dL (ref 30.0–36.0)
MCV: 84.4 fL (ref 78.0–100.0)
Platelets: 321 10*3/uL (ref 150–400)
RBC: 4.18 MIL/uL (ref 3.87–5.11)
RDW: 14.4 % (ref 11.5–15.5)
WBC: 5.7 10*3/uL (ref 4.0–10.5)

## 2014-02-07 LAB — TSH: TSH: 1.439 u[IU]/mL (ref 0.350–4.500)

## 2014-02-07 NOTE — Progress Notes (Signed)
Subjective:    Teresa Rojas is a 33 y.o. MW P421 (215 month old daughter Teresa Rojas) female who presents for an annual exam. The patient has no complaints today. The patient is sexually active. GYN screening history: last pap: was normal. The patient wears seatbelts: yes. The patient participates in regular exercise: yes. Has the patient ever been transfused or tattooed?: yes. (tattoo) The patient reports that there is not domestic violence in her life.   Menstrual History: OB History    Gravida Para Term Preterm AB TAB SAB Ectopic Multiple Living   1 1 1  0 0 0 0 0 0 1      Menarche age: 33  Patient's last menstrual period was 01/28/2014.    The following portions of the patient's history were reviewed and updated as appropriate: allergies, current medications, past family history, past medical history, past social history, past surgical history and problem list.  Review of Systems A comprehensive review of systems was negative.  Married for 2 years, denies dyspareunia. Has the Mirena, spots occasionally. She had a flu vaccine this season already. 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 Breast cancer in her m GM and p GM. She is still breastfeeding.   Objective:    BP 114/79 mmHg  Pulse 86  Resp 16  Ht 5\' 7"  (1.702 m)  Wt 210 lb (95.255 kg)  BMI 32.88 kg/m2  LMP 01/28/2014  Breastfeeding? Yes  General Appearance:    Alert, cooperative, no distress, appears stated age  Head:    Normocephalic, without obvious abnormality, atraumatic  Eyes:    PERRL, conjunctiva/corneas clear, EOM's intact, fundi    benign, both eyes  Ears:    Normal TM's and external ear canals, both ears  Nose:   Nares normal, septum midline, mucosa normal, no drainage    or sinus tenderness  Throat:   Lips, mucosa, and tongue normal; teeth and gums normal  Neck:   Supple, symmetrical, trachea midline, no adenopathy;    thyroid:  no enlargement/tenderness/nodules; no carotid   bruit or JVD  Back:      Symmetric, no curvature, ROM normal, no CVA tenderness  Lungs:     Clear to auscultation bilaterally, respirations unlabored  Chest Wall:    No tenderness or deformity   Heart:    Regular rate and rhythm, S1 and S2 normal, no murmur, rub   or gallop  Breast Exam:    No tenderness, masses, or nipple abnormality  Abdomen:     Soft, non-tender, bowel sounds active all four quadrants,    no masses, no organomegaly  Genitalia:    Normal female without lesion, discharge or tenderness, NSSR, NT, minimal mobility, normal adnexal exam     Extremities:   Extremities normal, atraumatic, no cyanosis or edema  Pulses:   2+ and symmetric all extremities  Skin:   Skin color, texture, turgor normal, no rashes or lesions  Lymph nodes:   Cervical, supraclavicular, and axillary nodes normal  Neurologic:   CNII-XII intact, normal strength, sensation and reflexes    throughout  .    Assessment:    Healthy female exam.    Plan:     Breast self exam technique reviewed and patient encouraged to perform self-exam monthly. Thin prep Pap smear. with cotesting Fasting labs

## 2014-02-08 ENCOUNTER — Telehealth: Payer: Self-pay | Admitting: *Deleted

## 2014-02-08 LAB — CYTOLOGY - PAP

## 2014-02-08 NOTE — Telephone Encounter (Signed)
Called pt to adv labs are nrml - still waiting on PAP. Pt expressed understanding.

## 2014-09-12 LAB — COMPREHENSIVE METABOLIC PANEL
ALBUMIN: 3.9
ALPHA 1: 71
ALT: 9
AST: 12 U/L
BUN: 10
CALCIUM: 9 mg/dL
CHLORIDE: 103 mmol/L
CO2: 26 mmol/L
Creatine, Serum: 0.57
EGFR (African American): >89
GLUCOSE: 74 mg/dL
POTASSIUM: 4.1 mmol/L
SODIUM: 137
Total Protein: 6.9 g/dL
Total bilirubin, fluid: 0.3

## 2015-03-31 NOTE — L&D Delivery Note (Signed)
Delivery Note At 10:11 AM a viable female was delivered via Vaginal, Spontaneous Delivery (Presentation:vertex ;LOA  ).  APGAR: 9, 9; weight  .   Placenta status:sponatneous ,duncan .  Cord:3VC  with the following complications:chronic abruption .  Cord pH: n/a  Anesthesia:  epidural Episiotomy: None Lacerations:  n/a Suture Repair: n/a Est. Blood Loss (mL):   150  Mom to postpartum.  Baby to Couplet care / Skin to Skin.  [name] 12/08/2015, 10:29 AM

## 2015-04-08 ENCOUNTER — Encounter: Payer: Self-pay | Admitting: Obstetrics & Gynecology

## 2015-04-08 ENCOUNTER — Ambulatory Visit (INDEPENDENT_AMBULATORY_CARE_PROVIDER_SITE_OTHER): Payer: BLUE CROSS/BLUE SHIELD | Admitting: Obstetrics & Gynecology

## 2015-04-08 VITALS — BP 126/81 | HR 88 | Resp 16 | Ht 67.0 in | Wt 229.0 lb

## 2015-04-08 DIAGNOSIS — Z30432 Encounter for removal of intrauterine contraceptive device: Secondary | ICD-10-CM | POA: Diagnosis not present

## 2015-04-08 NOTE — Progress Notes (Signed)
   Subjective:    Patient ID: Teresa Rojas, female    DOB: 02/20/1981, 35 y.o.   MRN: [id number]  HPI  She would like a pregnancy, has already downloaded a fertility app and is taking PNVs. She would like her Mirena removed.  Review of Systems Pap normal 11/15    Objective:   Physical Exam I prepped her cervix with betadine after consenting her. I used uterine dressing forceps to grasp her short IUD strings and easily removed the intact Mirena. She tolerated the procedure well.       Assessment & Plan:  Desire for pregnancy We discussed her celexa use. She is already cutting her pill in half and will try to wean herself off of it, as long as her anxiety doesn't cause her great grief.

## 2015-05-16 ENCOUNTER — Ambulatory Visit (INDEPENDENT_AMBULATORY_CARE_PROVIDER_SITE_OTHER): Payer: BLUE CROSS/BLUE SHIELD | Admitting: Obstetrics & Gynecology

## 2015-05-16 ENCOUNTER — Encounter: Payer: Self-pay | Admitting: Obstetrics & Gynecology

## 2015-05-16 VITALS — BP 125/79 | HR 104 | Resp 16 | Ht 67.0 in | Wt 230.0 lb

## 2015-05-16 DIAGNOSIS — O26851 Spotting complicating pregnancy, first trimester: Secondary | ICD-10-CM

## 2015-05-16 DIAGNOSIS — O209 Hemorrhage in early pregnancy, unspecified: Secondary | ICD-10-CM

## 2015-05-16 NOTE — Progress Notes (Signed)
   Subjective:    Patient ID: Teresa Rojas, female    DOB: [birth year], 35 y.o.   MRN: [id number]  HPI  35 yo MW G2P1 here about 7 weeks after her IUD was removed so that she could concieve. She has had some light spotting with a + HPT.  Review of Systems     Objective:   Physical Exam   WNWHWFNAD Breathing, conversing and ambulating normally Bedside u/s showed a 6 1/2 week fetus with a FHR of 158 and a subchorionic hemorrhage Her blood type is O+       Assessment & Plan:  Bleeding in early pregnancy- reassurance given Rec pelvic rest Bleeding precautions NOB visit 05-06-15

## 2015-06-03 ENCOUNTER — Ambulatory Visit (INDEPENDENT_AMBULATORY_CARE_PROVIDER_SITE_OTHER): Payer: BLUE CROSS/BLUE SHIELD | Admitting: Obstetrics & Gynecology

## 2015-06-03 ENCOUNTER — Encounter: Payer: Self-pay | Admitting: Obstetrics & Gynecology

## 2015-06-03 ENCOUNTER — Other Ambulatory Visit (HOSPITAL_COMMUNITY): Payer: Self-pay | Admitting: Obstetrics & Gynecology

## 2015-06-03 VITALS — BP 111/70 | HR 86 | Wt 231.0 lb

## 2015-06-03 DIAGNOSIS — Z3490 Encounter for supervision of normal pregnancy, unspecified, unspecified trimester: Secondary | ICD-10-CM | POA: Insufficient documentation

## 2015-06-03 DIAGNOSIS — Z3481 Encounter for supervision of other normal pregnancy, first trimester: Secondary | ICD-10-CM | POA: Diagnosis not present

## 2015-06-03 DIAGNOSIS — O99210 Obesity complicating pregnancy, unspecified trimester: Secondary | ICD-10-CM

## 2015-06-03 DIAGNOSIS — Z3491 Encounter for supervision of normal pregnancy, unspecified, first trimester: Secondary | ICD-10-CM

## 2015-06-03 NOTE — Progress Notes (Signed)
   Subjective:    Teresa Rojas is a 35 yo MW  G2P1001 3079w0d being seen today for her first obstetrical visit.  Her obstetrical history is significant for obesity. Patient does intend to breast feed. Pregnancy history fully reviewed.  Patient reports no complaints.  Filed Vitals:   06/03/15 1357  BP: 111/70  Pulse: 86  Weight: 231 lb (104.781 kg)    HISTORY: OB History  Gravida Para Term Preterm AB SAB TAB Ectopic Multiple Living  2 1 1  0 0 0 0 0 0 1    # Outcome Date GA Lbr Len/2nd Weight Sex Delivery Anes PTL Lv  2 Current           1 Term 09/10/13 4924w1d 06:25 / 00:51 7 lb 2.6 oz (3.25 kg) F Vag-Spont EPI  Y     Past Medical History  Diagnosis Date  . 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 Vision loss    Past Surgical History  Procedure Laterality Date  . Wisdom tooth extraction     Family History  Problem Relation Age of Onset  . Breast cancer Maternal Grandmother   . Breast cancer Paternal Grandmother      Exam    Uterus:     Pelvic Exam:    Perineum: No Hemorrhoids   Vulva: normal   Vagina:  normal mucosa   pH:    Cervix: anteverted   Adnexa: normal adnexa   Bony Pelvis: android  System: Breast:  normal appearance, no masses or tenderness   Skin: normal coloration and turgor, no rashes    Neurologic: oriented   Extremities: normal strength, tone, and muscle mass   HEENT PERRLA   Mouth/Teeth mucous membranes moist, pharynx normal without lesions   Neck supple   Cardiovascular: regular rate and rhythm   Respiratory:  appears well, vitals normal, no respiratory distress, acyanotic, normal RR, ear and throat exam is normal, neck free of mass or lymphadenopathy, chest clear, no wheezing, crepitations, rhonchi, normal symmetric air entry   Abdomen: soft, non-tender; bowel sounds normal; no masses,  no organomegaly   Urinary: urethral meatus normal      Assessment:    Pregnancy: G2P1001 Patient Active Problem List   Diagnosis Date Noted  .  Normal pregnancy in first trimester 06/03/2015  . Obesity in pregnancy, antepartum 06/03/2015        Plan:     Initial labs drawn. Prenatal vitamins. Problem list reviewed and updated. Genetic Screening discussed First Screen: declined.  Ultrasound discussed; fetal survey: requested.  Follow up in 4 weeks.  Kapil Petropoulos C. 06/03/2015

## 2015-06-04 LAB — OBSTETRIC PANEL
Antibody Screen: NEGATIVE
BASOS PCT: 1 % (ref 0–1)
Basophils Absolute: 0.1 10*3/uL (ref 0.0–0.1)
EOS ABS: 0.2 10*3/uL (ref 0.0–0.7)
EOS PCT: 3 % (ref 0–5)
HEMATOCRIT: 37 % (ref 36.0–46.0)
HEMOGLOBIN: 12.1 g/dL (ref 12.0–15.0)
Hepatitis B Surface Ag: NEGATIVE
Lymphocytes Relative: 29 % (ref 12–46)
Lymphs Abs: 2.1 10*3/uL (ref 0.7–4.0)
MCH: 29.2 pg (ref 26.0–34.0)
MCHC: 32.7 g/dL (ref 30.0–36.0)
MCV: 89.4 fL (ref 78.0–100.0)
MONO ABS: 0.4 10*3/uL (ref 0.1–1.0)
MONOS PCT: 6 % (ref 3–12)
MPV: 9.6 fL (ref 8.6–12.4)
Neutro Abs: 4.3 10*3/uL (ref 1.7–7.7)
Neutrophils Relative %: 61 % (ref 43–77)
Platelets: 306 10*3/uL (ref 150–400)
RBC: 4.14 MIL/uL (ref 3.87–5.11)
RDW: 14.2 % (ref 11.5–15.5)
RH TYPE: POSITIVE
Rubella: 1.33 Index — ABNORMAL HIGH (ref <0.90)
WBC: 7.1 10*3/uL (ref 4.0–10.5)

## 2015-06-04 LAB — GC/CHLAMYDIA PROBE AMP
CT Probe RNA: NOT DETECTED
GC PROBE AMP APTIMA: NOT DETECTED

## 2015-06-04 LAB — HIV ANTIBODY (ROUTINE TESTING W REFLEX): HIV: NONREACTIVE

## 2015-06-07 ENCOUNTER — Telehealth: Payer: Self-pay | Admitting: *Deleted

## 2015-06-07 DIAGNOSIS — N390 Urinary tract infection, site not specified: Secondary | ICD-10-CM

## 2015-06-07 LAB — CULTURE, URINE COMPREHENSIVE: Colony Count: >=100000

## 2015-06-07 MED ORDER — NITROFURANTOIN MONOHYD MACRO 100 MG PO CAPS: 100.0000 mg | ORAL_CAPSULE | Freq: Two times a day (BID) | ORAL | Status: DC

## 2015-06-07 NOTE — Telephone Encounter (Signed)
LM on voicemail stating that she does have UTI per Dr Marice Potterove and [facility] [address]

## 2015-06-07 NOTE — Telephone Encounter (Signed)
-----   Message from Allie BossierMyra C Dove, MD sent at 06/07/2015  8:31 AM EST ----- macrobid

## 2015-06-12 ENCOUNTER — Ambulatory Visit (INDEPENDENT_AMBULATORY_CARE_PROVIDER_SITE_OTHER): Payer: BLUE CROSS/BLUE SHIELD | Admitting: Obstetrics & Gynecology

## 2015-06-12 VITALS — BP 106/71 | HR 77 | Wt 231.0 lb

## 2015-06-12 DIAGNOSIS — O209 Hemorrhage in early pregnancy, unspecified: Secondary | ICD-10-CM

## 2015-06-12 NOTE — Progress Notes (Signed)
Work in visit for BRVB yesterday, brown today. U/S shows a small SCH, good FM and FHR Reassurance given

## 2015-06-12 NOTE — Progress Notes (Signed)
Fair amount of bleeding last night

## 2015-06-17 ENCOUNTER — Telehealth: Payer: Self-pay | Admitting: *Deleted

## 2015-06-17 DIAGNOSIS — N390 Urinary tract infection, site not specified: Secondary | ICD-10-CM

## 2015-06-17 MED ORDER — NITROFURANTOIN MONOHYD MACRO 100 MG PO CAPS: 100.0000 mg | ORAL_CAPSULE | Freq: Two times a day (BID) | ORAL | Status: DC

## 2015-06-17 NOTE — Telephone Encounter (Signed)
-----   Message from Allie BossierMyra C Dove, MD sent at 06/17/2015  4:19 PM EDT ----- Please give her macrobid for a week. Thanks

## 2015-06-17 NOTE — Telephone Encounter (Signed)
Pt notified of bacteria growing in urine.  Per Dr Marice Potterove needs Macrobid BID for 1 week.  [facility]

## 2015-06-27 ENCOUNTER — Other Ambulatory Visit: Payer: BLUE CROSS/BLUE SHIELD

## 2015-06-27 NOTE — Progress Notes (Signed)
Pt here for bedside U/S.  She had a small amount of bleeding last night after pushing her daughter in stroller.  She denies any pain.  She does have a h/o subchorionic hemorrhage this pregnancy.  Small SCH seen today but baby is active in utero and FHT is 141.  Tole patient anytime she has some bleeding to just call and come by for a quick U/S to help alleviate any anxiety.  Pt voices understanding and will hold on intercourse at this time.

## 2015-07-02 ENCOUNTER — Ambulatory Visit (INDEPENDENT_AMBULATORY_CARE_PROVIDER_SITE_OTHER): Payer: BLUE CROSS/BLUE SHIELD | Admitting: Obstetrics & Gynecology

## 2015-07-02 VITALS — BP 108/64 | HR 91 | Wt 229.0 lb

## 2015-07-02 DIAGNOSIS — O99210 Obesity complicating pregnancy, unspecified trimester: Secondary | ICD-10-CM | POA: Diagnosis not present

## 2015-07-02 DIAGNOSIS — Z3482 Encounter for supervision of other normal pregnancy, second trimester: Secondary | ICD-10-CM

## 2015-07-02 DIAGNOSIS — O4692 Antepartum hemorrhage, unspecified, second trimester: Secondary | ICD-10-CM | POA: Diagnosis not present

## 2015-07-02 DIAGNOSIS — Z3491 Encounter for supervision of normal pregnancy, unspecified, first trimester: Secondary | ICD-10-CM

## 2015-07-02 NOTE — Progress Notes (Signed)
Subjective:  Teresa Rojas is a 35 y.o. G2P1001 at [redacted]w[redacted]d being seen today for ongoing prenatal care.  She is currently monitored for the following issues for this high-risk pregnancy and has Normal pregnancy in first trimester; Obesity in pregnancy, antepartum; and Second trimester bleeding on her problem list.  Patient reports bleeding.   . Vag. Bleeding: Small.   . Denies leaking of fluid.   The following portions of the patient's history were reviewed and updated as appropriate: allergies, current medications, past family history, past medical history, past social history, past surgical history and problem list. Problem list updated.  Objective:   Filed Vitals:   07/02/15 1409  BP: 108/64  Pulse: 91  Weight: 229 lb (103.874 kg)    Fetal Status: Fetal Heart Rate (bpm): 147         General:  Alert, oriented and cooperative. Patient is in no acute distress.  Skin: Skin is warm and dry. No rash noted.   Cardiovascular: Normal heart rate noted  Respiratory: Normal respiratory effort, no problems with respiration noted  Abdomen: Soft, gravid, appropriate for gestational age.       Pelvic: Vag. Bleeding: Small Vag D/C Character: Thin   Cervical exam deferred        Extremities: Normal range of motion.  Edema: None  Mental Status: Normal mood and affect. Normal behavior. Normal judgment and thought content.   Urinalysis: Urine Protein: Negative Urine Glucose: Negative  Assessment and Plan:  Pregnancy: G2P1001 at [redacted]w[redacted]d  1. Obesity in pregnancy 12 pound weight gain goal - Glucose Tolerance, 1 HR (50g)  2. Second trimester bleeding US at MFM to evaluate placenta and Subchorionic Hemorrhage  Preterm labor symptoms and general obstetric precautions including but not limited to vaginal bleeding, contractions, leaking of fluid and fetal movement were reviewed in detail with the patient. Please refer to After Visit Summary for other counseling recommendations.  Return in about 4 weeks  (around 07/30/2015).   Lesly DukesKelly H Addiel Mccardle, MD

## 2015-07-02 NOTE — Progress Notes (Signed)
Pt does have a small subchorionic hemorrhage

## 2015-07-03 LAB — GLUCOSE TOLERANCE, 1 HOUR (50G) W/O FASTING: GLUCOSE, 1 HR, GESTATIONAL: 103 mg/dL (ref <140)

## 2015-07-08 ENCOUNTER — Ambulatory Visit (HOSPITAL_COMMUNITY)
Admission: RE | Admit: 2015-07-08 | Discharge: 2015-07-08 | Disposition: A | Discharge status: Home / Self Care | Payer: BLUE CROSS/BLUE SHIELD | Source: Ambulatory Visit | Attending: Obstetrics & Gynecology | Admitting: Obstetrics & Gynecology

## 2015-07-08 DIAGNOSIS — Z3A15 15 weeks gestation of pregnancy: Secondary | ICD-10-CM | POA: Diagnosis not present

## 2015-07-08 DIAGNOSIS — O4692 Antepartum hemorrhage, unspecified, second trimester: Secondary | ICD-10-CM | POA: Diagnosis present

## 2015-07-08 DIAGNOSIS — O99212 Obesity complicating pregnancy, second trimester: Secondary | ICD-10-CM | POA: Diagnosis not present

## 2015-07-16 ENCOUNTER — Telehealth: Payer: Self-pay

## 2015-07-16 DIAGNOSIS — Z3492 Encounter for supervision of normal pregnancy, unspecified, second trimester: Secondary | ICD-10-CM

## 2015-07-16 NOTE — Telephone Encounter (Signed)
Patient called back and made aware that she does have a subchorionic hemorrhage and will need a follow up ultrasound. Patient also given bleeding precautions. She states that her light bleeding now is dark/ brown colored blood- assured that this is old blood. Ordered place for anatomy ultrasound. Armandina StammerJennifer Mallissa Lorenzen RN BSN

## 2015-07-16 NOTE — Telephone Encounter (Signed)
-----   Message from Lesly DukesKelly H Leggett, MD sent at 07/15/2015  1:17 PM EDT ----- Moderate subchorionic hemorrhage approx 6 x 2.8 x 5.4.  Pt needs f/u US in 3 weeks for anatomy and [facility].  RN to call and make sure pt aware of diagnosis and plan.

## 2015-07-16 NOTE — Telephone Encounter (Signed)
Patient called and left message for her to return call to office to talk about plan of care. Armandina StammerJennifer Marcelle Hepner RN BSN

## 2015-07-30 ENCOUNTER — Ambulatory Visit (INDEPENDENT_AMBULATORY_CARE_PROVIDER_SITE_OTHER): Payer: BLUE CROSS/BLUE SHIELD | Admitting: Obstetrics & Gynecology

## 2015-07-30 VITALS — BP 99/64 | HR 90 | Wt 232.0 lb

## 2015-07-30 DIAGNOSIS — O4692 Antepartum hemorrhage, unspecified, second trimester: Secondary | ICD-10-CM | POA: Diagnosis not present

## 2015-07-30 DIAGNOSIS — Z3482 Encounter for supervision of other normal pregnancy, second trimester: Secondary | ICD-10-CM | POA: Diagnosis not present

## 2015-07-30 MED ORDER — CYCLOBENZAPRINE HCL 10 MG PO TABS: 10.0000 mg | ORAL_TABLET | Freq: Three times a day (TID) | ORAL | Status: DC | PRN

## 2015-07-30 NOTE — Progress Notes (Signed)
Subjective:  Jetta Loutatalie R Mineo is a 35 y.o. G2P1001 at 1933w2d being seen today for ongoing prenatal care.  She is currently monitored for the following issues for this high-risk pregnancy and has Normal pregnancy in first trimester; Obesity in pregnancy, antepartum; and Second trimester bleeding on her problem list.  Patient reports bleeding and headache.  Contractions: Not present. Vag. Bleeding: Scant.  Movement: Present. Denies leaking of fluid.   The following portions of the patient's history were reviewed and updated as appropriate: allergies, current medications, past family history, past medical history, past social history, past surgical history and problem list. Problem list updated.  Objective:   Filed Vitals:   07/30/15 1513  BP: 99/64  Pulse: 90  Weight: 232 lb (105.235 kg)    Fetal Status: Fetal Heart Rate (bpm): 146   Movement: Present     General:  Alert, oriented and cooperative. Patient is in no acute distress.  Skin: Skin is warm and dry. No rash noted.   Cardiovascular: Normal heart rate noted  Respiratory: Normal respiratory effort, no problems with respiration noted  Abdomen: Soft, gravid, appropriate for gestational age. Pain/Pressure: Absent     Pelvic: Vag. Bleeding: Scant Vag D/C Character: Thin   Cervical exam deferred        Extremities: Normal range of motion.  Edema: None  Mental Status: Normal mood and affect. Normal behavior. Normal judgment and thought content.   Urinalysis: Urine Protein: Negative Urine Glucose: Negative  Assessment and Plan:  Pregnancy: G2P1001 at 4433w2d  1. Subchorionic hemorrhage--bleeding is brown and lighter.  Pt has f/u US next week.  Pt can come to our office until 22 weeks if increased bleeding.  If bleeding worsens after 22 weeks, pt should go to MAU.  2.  Anatomy US scheduled  3.  Headaches--exam c/w muscle tension--heating packs, flexeril, and massage.\  There are no diagnoses linked to this encounter. Preterm labor  symptoms and general obstetric precautions including but not limited to vaginal bleeding, contractions, leaking of fluid and fetal movement were reviewed in detail with the patient. Please refer to After Visit Summary for other counseling recommendations.  Return in about 4 weeks (around 08/27/2015).   Lesly DukesKelly H Shandrika Ambers, MD

## 2015-08-07 ENCOUNTER — Other Ambulatory Visit: Payer: Self-pay | Admitting: Obstetrics & Gynecology

## 2015-08-07 ENCOUNTER — Ambulatory Visit (HOSPITAL_COMMUNITY)
Admission: RE | Admit: 2015-08-07 | Discharge: 2015-08-07 | Disposition: A | Discharge status: Home / Self Care | Payer: BLUE CROSS/BLUE SHIELD | Source: Ambulatory Visit | Attending: Obstetrics & Gynecology | Admitting: Obstetrics & Gynecology

## 2015-08-07 ENCOUNTER — Encounter (HOSPITAL_COMMUNITY): Payer: Self-pay

## 2015-08-07 VITALS — BP 103/67 | HR 87 | Wt 233.6 lb

## 2015-08-07 DIAGNOSIS — Z1389 Encounter for screening for other disorder: Secondary | ICD-10-CM

## 2015-08-07 DIAGNOSIS — O4402 Placenta previa specified as without hemorrhage, second trimester: Secondary | ICD-10-CM

## 2015-08-07 DIAGNOSIS — IMO0002 Reserved for concepts with insufficient information to code with codable children: Secondary | ICD-10-CM

## 2015-08-07 DIAGNOSIS — O4592 Premature separation of placenta, unspecified, second trimester: Secondary | ICD-10-CM | POA: Diagnosis present

## 2015-08-07 DIAGNOSIS — Z3491 Encounter for supervision of normal pregnancy, unspecified, first trimester: Secondary | ICD-10-CM

## 2015-08-07 DIAGNOSIS — Z0489 Encounter for examination and observation for other specified reasons: Secondary | ICD-10-CM

## 2015-08-07 DIAGNOSIS — Z3A19 19 weeks gestation of pregnancy: Secondary | ICD-10-CM

## 2015-08-07 DIAGNOSIS — Z36 Encounter for antenatal screening of mother: Secondary | ICD-10-CM | POA: Diagnosis not present

## 2015-08-07 DIAGNOSIS — O350XX0 Maternal care for (suspected) central nervous system malformation in fetus, not applicable or unspecified: Secondary | ICD-10-CM

## 2015-08-07 DIAGNOSIS — Z3492 Encounter for supervision of normal pregnancy, unspecified, second trimester: Secondary | ICD-10-CM

## 2015-08-16 DIAGNOSIS — O4400 Placenta previa specified as without hemorrhage, unspecified trimester: Secondary | ICD-10-CM | POA: Insufficient documentation

## 2015-08-16 DIAGNOSIS — O350XX0 Maternal care for (suspected) central nervous system malformation in fetus, not applicable or unspecified: Secondary | ICD-10-CM | POA: Insufficient documentation

## 2015-08-16 DIAGNOSIS — IMO0002 Reserved for concepts with insufficient information to code with codable children: Secondary | ICD-10-CM | POA: Insufficient documentation

## 2015-08-27 ENCOUNTER — Ambulatory Visit (INDEPENDENT_AMBULATORY_CARE_PROVIDER_SITE_OTHER): Payer: BLUE CROSS/BLUE SHIELD | Admitting: Obstetrics & Gynecology

## 2015-08-27 VITALS — BP 114/65 | HR 88 | Wt 241.0 lb

## 2015-08-27 DIAGNOSIS — O4412 Placenta previa with hemorrhage, second trimester: Secondary | ICD-10-CM

## 2015-08-27 DIAGNOSIS — O4402 Placenta previa specified as without hemorrhage, second trimester: Secondary | ICD-10-CM

## 2015-08-27 DIAGNOSIS — Z3482 Encounter for supervision of other normal pregnancy, second trimester: Secondary | ICD-10-CM

## 2015-08-27 NOTE — Progress Notes (Signed)
Continues to have some spotting (H/O sub chorionic hemorrhage)

## 2015-08-27 NOTE — Progress Notes (Signed)
Subjective:  Teresa Rojas is a 35 y.o. G2P1001 at 7466w2d being seen today for ongoing prenatal care.  She is currently monitored for the following issues for this high-risk pregnancy and has Normal pregnancy in first trimester; Obesity in pregnancy, antepartum; Second trimester bleeding; Placenta previa antepartum; and Choroid plexus cyst of fetus on prenatal ultrasound on her problem list.  Patient reports brownish old blood--scant.  Contractions: Not present. Vag. Bleeding: Scant.  Movement: Present. Denies leaking of fluid.   The following portions of the patient's history were reviewed and updated as appropriate: allergies, current medications, past family history, past medical history, past social history, past surgical history and problem list. Problem list updated.  Objective:   Filed Vitals:   08/27/15 1543  BP: 114/65  Pulse: 88  Weight: 241 lb (109.317 kg)    Fetal Status: Fetal Heart Rate (bpm): 151   Movement: Present     General:  Alert, oriented and cooperative. Patient is in no acute distress.  Skin: Skin is warm and dry. No rash noted.   Cardiovascular: Normal heart rate noted  Respiratory: Normal respiratory effort, no problems with respiration noted  Abdomen: Soft, gravid, appropriate for gestational age. Pain/Pressure: Absent     Pelvic: Vag. Bleeding: Scant Vag D/C Character: Thin   Cervical exam deferred        Extremities: Normal range of motion.  Edema: Mild pitting, slight indentation  Mental Status: Normal mood and affect. Normal behavior. Normal judgment and thought content.   Urinalysis: Urine Protein: Trace Urine Glucose: Negative  Assessment and Plan:  Pregnancy: G2P1001 at 1266w2d  There are no diagnoses linked to this encounter. Preterm labor symptoms and general obstetric precautions including but not limited to vaginal bleeding, contractions, leaking of fluid and fetal movement were reviewed in detail with the patient. Please refer to After Visit  Summary for other counseling recommendations.   1-MFM US for CP cysts and placenta previa and subchorionic hem in 2nd trimester  2-vag rest  3-RTC 4 weeks  Lesly DukesKelly H Massiah Minjares, MD

## 2015-09-03 ENCOUNTER — Other Ambulatory Visit (HOSPITAL_COMMUNITY): Payer: Self-pay | Admitting: Maternal and Fetal Medicine

## 2015-09-03 ENCOUNTER — Encounter (HOSPITAL_COMMUNITY): Payer: Self-pay

## 2015-09-03 ENCOUNTER — Ambulatory Visit (HOSPITAL_COMMUNITY)
Admission: RE | Admit: 2015-09-03 | Discharge: 2015-09-03 | Disposition: A | Discharge status: Home / Self Care | Payer: BLUE CROSS/BLUE SHIELD | Source: Ambulatory Visit | Attending: Obstetrics & Gynecology | Admitting: Obstetrics & Gynecology

## 2015-09-03 VITALS — BP 105/63 | HR 85 | Wt 239.4 lb

## 2015-09-03 DIAGNOSIS — O99210 Obesity complicating pregnancy, unspecified trimester: Secondary | ICD-10-CM

## 2015-09-03 DIAGNOSIS — IMO0002 Reserved for concepts with insufficient information to code with codable children: Secondary | ICD-10-CM

## 2015-09-03 DIAGNOSIS — Z3A23 23 weeks gestation of pregnancy: Secondary | ICD-10-CM | POA: Insufficient documentation

## 2015-09-03 DIAGNOSIS — O4412 Placenta previa with hemorrhage, second trimester: Secondary | ICD-10-CM | POA: Diagnosis not present

## 2015-09-03 DIAGNOSIS — O359XX0 Maternal care for (suspected) fetal abnormality and damage, unspecified, not applicable or unspecified: Secondary | ICD-10-CM

## 2015-09-03 DIAGNOSIS — O4402 Placenta previa specified as without hemorrhage, second trimester: Secondary | ICD-10-CM

## 2015-09-03 DIAGNOSIS — O358XX0 Maternal care for other (suspected) fetal abnormality and damage, not applicable or unspecified: Secondary | ICD-10-CM | POA: Diagnosis present

## 2015-09-03 DIAGNOSIS — O4692 Antepartum hemorrhage, unspecified, second trimester: Secondary | ICD-10-CM

## 2015-09-03 DIAGNOSIS — Z0489 Encounter for examination and observation for other specified reasons: Secondary | ICD-10-CM

## 2015-09-03 DIAGNOSIS — Z36 Encounter for antenatal screening of mother: Secondary | ICD-10-CM | POA: Insufficient documentation

## 2015-09-20 ENCOUNTER — Inpatient Hospital Stay (HOSPITAL_COMMUNITY)
Admission: AD | Admit: 2015-09-20 | Discharge: 2015-09-26 | DRG: 782 | Disposition: A | Discharge status: Home / Self Care | Payer: BLUE CROSS/BLUE SHIELD | Source: Ambulatory Visit | Attending: Obstetrics and Gynecology | Admitting: Obstetrics and Gynecology

## 2015-09-20 ENCOUNTER — Encounter (HOSPITAL_COMMUNITY): Payer: Self-pay | Admitting: *Deleted

## 2015-09-20 DIAGNOSIS — Z3A25 25 weeks gestation of pregnancy: Secondary | ICD-10-CM | POA: Diagnosis not present

## 2015-09-20 DIAGNOSIS — Z6837 Body mass index (BMI) 37.0-37.9, adult: Secondary | ICD-10-CM | POA: Diagnosis not present

## 2015-09-20 DIAGNOSIS — O4412 Placenta previa with hemorrhage, second trimester: Secondary | ICD-10-CM | POA: Diagnosis not present

## 2015-09-20 DIAGNOSIS — O99210 Obesity complicating pregnancy, unspecified trimester: Secondary | ICD-10-CM

## 2015-09-20 DIAGNOSIS — O4452 Low lying placenta with hemorrhage, second trimester: Principal | ICD-10-CM | POA: Diagnosis present

## 2015-09-20 DIAGNOSIS — Z803 Family history of malignant neoplasm of breast: Secondary | ICD-10-CM | POA: Diagnosis not present

## 2015-09-20 DIAGNOSIS — O99212 Obesity complicating pregnancy, second trimester: Secondary | ICD-10-CM | POA: Diagnosis present

## 2015-09-20 DIAGNOSIS — O4402 Placenta previa specified as without hemorrhage, second trimester: Secondary | ICD-10-CM

## 2015-09-20 DIAGNOSIS — N939 Abnormal uterine and vaginal bleeding, unspecified: Secondary | ICD-10-CM

## 2015-09-20 DIAGNOSIS — E669 Obesity, unspecified: Secondary | ICD-10-CM | POA: Diagnosis present

## 2015-09-20 DIAGNOSIS — O4692 Antepartum hemorrhage, unspecified, second trimester: Secondary | ICD-10-CM | POA: Diagnosis not present

## 2015-09-20 LAB — CBC
HCT: 30.9 % — ABNORMAL LOW (ref 36.0–46.0)
HEMOGLOBIN: 10.4 g/dL — AB (ref 12.0–15.0)
MCH: 29.5 pg (ref 26.0–34.0)
MCHC: 33.7 g/dL (ref 30.0–36.0)
MCV: 87.5 fL (ref 78.0–100.0)
Platelets: 256 10*3/uL (ref 150–400)
RBC: 3.53 MIL/uL — ABNORMAL LOW (ref 3.87–5.11)
RDW: 13.6 % (ref 11.5–15.5)
WBC: 9.1 10*3/uL (ref 4.0–10.5)

## 2015-09-20 LAB — TYPE AND SCREEN
ABO/RH(D): O POS
ANTIBODY SCREEN: NEGATIVE

## 2015-09-20 MED ORDER — ZOLPIDEM TARTRATE 5 MG PO TABS
5.0000 mg | ORAL_TABLET | Freq: Every evening | ORAL | Status: DC | PRN
Start: 2015-09-20 — End: 2015-09-26

## 2015-09-20 MED ORDER — CALCIUM CARBONATE ANTACID 500 MG PO CHEW
2.0000 | CHEWABLE_TABLET | ORAL | Status: DC | PRN
  Filled 2015-09-20: qty 2

## 2015-09-20 MED ORDER — SODIUM CHLORIDE 0.9 % IV SOLN: 250.0000 mL | INTRAVENOUS | Status: DC | PRN

## 2015-09-20 MED ORDER — SODIUM CHLORIDE 0.9% FLUSH: 3.0000 mL | INTRAVENOUS | Status: DC | PRN

## 2015-09-20 MED ORDER — BETAMETHASONE SOD PHOS & ACET 6 (3-3) MG/ML IJ SUSP
12.0000 mg | INTRAMUSCULAR | Status: AC
  Administered 2015-09-20 – 2015-09-21 (×2): 12 mg via INTRAMUSCULAR
  Filled 2015-09-20 (×2): qty 2

## 2015-09-20 MED ORDER — SODIUM CHLORIDE 0.9% FLUSH
3.0000 mL | Freq: Two times a day (BID) | INTRAVENOUS | Status: DC
  Administered 2015-09-20 – 2015-09-25 (×11): 3 mL via INTRAVENOUS

## 2015-09-20 MED ORDER — ACETAMINOPHEN 325 MG PO TABS
650.0000 mg | ORAL_TABLET | ORAL | Status: DC | PRN
Start: 2015-09-20 — End: 2015-09-26

## 2015-09-20 MED ORDER — PRENATAL MULTIVITAMIN CH
1.0000 | ORAL_TABLET | Freq: Every day | ORAL | Status: DC
Start: 2015-09-21 — End: 2015-09-26
  Administered 2015-09-21 – 2015-09-25 (×5): 1 via ORAL
  Filled 2015-09-20 (×7): qty 1

## 2015-09-20 MED ORDER — DOCUSATE SODIUM 100 MG PO CAPS
100.0000 mg | ORAL_CAPSULE | Freq: Every day | ORAL | Status: DC
  Administered 2015-09-21 – 2015-09-25 (×5): 100 mg via ORAL
  Filled 2015-09-20 (×7): qty 1

## 2015-09-20 NOTE — MAU Note (Signed)
Pt states she has a previa and was told to come here if she had any bleeding. States she always has brown discharge and today bright red bleeding after lunch today. Pt. States she changed her pad/liner two times and noted more blood so is here for evaluation. Pt. States baby is moving well. Denies contractions or abdominal tightening. Pt. States she passed a small pea sized pea brown blood clot yesterday. Denies seeing any clots today. Next ultrasound is August 2. [place]. [place].

## 2015-09-20 NOTE — MAU Provider Note (Signed)
History     CSN: [id number]  Arrival date and time: 09/20/15 1451   First Provider Initiated Contact with Patient 09/20/15 1533      Chief Complaint  Patient presents with  . Vaginal Bleeding   HPI  Ms. Teresa Rojas is a 35 y.o. G2P1001 at 4758w5d who presents to MAU today with complaint of vaginal bleeding. The patient has a known posterior placenta previa. She states that she has had brown discharge most days, but today had heavier bright red vaginal bleeding. She states that she had episodes of bleeding like this much earlier in the pregnancy that did not require admission since she was pre-viable. She states that last episode of bleeding was over 1 month ago. She denies pain, contractions, LOF today. She denies other complications with this pregnancy. She reports good fetal movement.   OB History    Gravida Para Term Preterm AB TAB SAB Ectopic Multiple Living   2 1 1  0 0 0 0 0 0 1      Past Medical History  Diagnosis Date  . 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 Vision loss     Past Surgical History  Procedure Laterality Date  . Wisdom tooth extraction      Family History  Problem Relation Age of Onset  . Breast cancer Maternal Grandmother   . Breast cancer Paternal Grandmother     Social History  Substance Use Topics  . Smoking status: Never Smoker   . Smokeless tobacco: Never Used  . Alcohol Use: No    Allergies: No Known Allergies  Prescriptions prior to admission  Medication Sig Dispense Refill Last Dose  . citalopram (CELEXA) 20 MG tablet Take 20 mg by mouth daily.  1 09/19/2015 at Unknown time  . Prenatal Vit-Fe Fumarate-FA (MULTIVITAMIN-PRENATAL) 27-0.8 MG TABS tablet Take 1 tablet by mouth daily at 12 noon.   09/19/2015 at Unknown time  . cyclobenzaprine (FLEXERIL) 10 MG tablet Take 1 tablet (10 mg total) by mouth every 8 (eight) hours as needed for muscle spasms. (Patient not taking: Reported on 09/03/2015) 30 tablet 1 More than a month at Unknown  time    Review of Systems  Gastrointestinal: Negative for abdominal pain.  Genitourinary:       + vaginal bleeding Neg - vaginal discharge, LOF   Physical Exam   Blood pressure 117/63, pulse 81, temperature 98.3 F (36.8 C), temperature source Oral, resp. rate 18, height 5\' 7"  (1.702 m), weight 241 lb (109.317 kg), last menstrual period 03/28/2015.  Physical Exam  Nursing note and vitals reviewed. Constitutional: She is oriented to person, place, and time. She appears well-developed and well-nourished. No distress.  HENT:  Head: Normocephalic and atraumatic.  Cardiovascular: Normal rate.   Respiratory: Effort normal.  GI: Soft. She exhibits no distension.  Genitourinary: Uterus is not enlarged and not tender. Cervix exhibits no motion tenderness, no discharge and no friability. Right adnexum displays no mass and no tenderness. Left adnexum displays no mass and no tenderness. There is bleeding (scant light red blood in the vaginal vault) in the vagina. No vaginal discharge found.  Cervix: visually appears closed and thick  Neurological: She is alert and oriented to person, place, and time.  Skin: Skin is warm and dry. No erythema.  Psychiatric: She has a normal mood and affect.   Fetal Monitoring: Baseline: 130 bpm Variability: moderate Accelerations: 10 x 10 Decelerations: none Contractions: none  MAU Course  Procedures None  MDM Discussed  with Dr. Shawnie PonsPratt. Admit to Antenatal for monitoring for bleeding.   Assessment and Plan  A: SIUP at 5322w5d Posterior previa Vaginal bleeding in pregnancy, second trimester  P: Admit to Antenatal  Marny LowensteinJulie N Wenzel, PA-C  09/20/2015, 3:49 PM

## 2015-09-20 NOTE — H&P (Signed)
  Jetta Loutatalie R Plain is an 35 y.o. G2P1001 4080w5d female.   Chief Complaint: Vaginal bleeding HPI: patient presents with vaginal bleeding. She is a known posterior previa with bleeding throughout pregnancy. Previously pre-viable and so wasn't concerned. Now is viable and having continued bright red bleeding. Denies LOF or pain. No contractions noted. Fetus continues to move well.  Past Medical History  Diagnosis Date  . 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 Vision loss     Past Surgical History  Procedure Laterality Date  . Wisdom tooth extraction      Family History  Problem Relation Age of Onset  . Breast cancer Maternal Grandmother   . Breast cancer Paternal Grandmother    Social History:  reports that she has never smoked. She has never used smokeless tobacco. She reports that she does not drink alcohol or use illicit drugs.   No Known Allergies  No current facility-administered medications on file prior to encounter.   Current Outpatient Prescriptions on File Prior to Encounter  Medication Sig Dispense Refill  . citalopram (CELEXA) 20 MG tablet Take 20 mg by mouth daily.  1  . Prenatal Vit-Fe Fumarate-FA (MULTIVITAMIN-PRENATAL) 27-0.8 MG TABS tablet Take 1 tablet by mouth daily at 12 noon.    . cyclobenzaprine (FLEXERIL) 10 MG tablet Take 1 tablet (10 mg total) by mouth every 8 (eight) hours as needed for muscle spasms. (Patient not taking: Reported on 09/03/2015) 30 tablet 1    A comprehensive review of systems was negative.  Blood pressure 117/63, pulse 81, temperature 98.3 F (36.8 C), temperature source Oral, resp. rate 18, height 5\' 7"  (1.702 m), weight 241 lb (109.317 kg), last menstrual period 03/28/2015. General appearance: alert, cooperative and appears stated age Head: Normocephalic, without obvious abnormality, atraumatic Neck: supple, symmetrical, trachea midline Lungs: normal effort Heart: regular rate and rhythm Abdomen: soft, non-tender; bowel sounds normal;  no masses,  no organomegaly Extremities: Homans sign is negative, no sign of DVT Skin: Skin color, texture, turgor normal. No rashes or lesions Neurologic: Grossly normal   Lab Results  Component Value Date   WBC 9.1 09/20/2015   HGB 10.4* 09/20/2015   HCT 30.9* 09/20/2015   MCV 87.5 09/20/2015   PLT 256 09/20/2015         ABO, Rh: --/--/O POS (06/23 1619)  Antibody: NEG (06/23 1619)  Rubella: !Error!  RPR: NON REAC (03/06 1410)  HBsAg: NEGATIVE (03/06 1410)  HIV: NONREACTIVE (03/06 1410)  GBS:       Prenatal Transfer Tool  Maternal Diabetes: No Genetic Screening: Declined Maternal Ultrasounds/Referrals: Normal Fetal Ultrasounds or other Referrals:  None Maternal Substance Abuse:  No Significant Maternal Medications:  Meds include: Other: Celexa and Flexeril Significant Maternal Lab Results: None    Assessment/Plan Principal Problem:   Placenta previa with hemorrhage in second trimester Active Problems:   Placenta previa antepartum in second trimester  Inpatient x 5-7 days with no bleeding Strict bedrest/pelvic rest BMZ Magnesium for CP prophylaxis if delivery more imminent NICU consult Type and Screen q 72 hours. Ok with blood transfusion if needed.   Jacci Ruberg S 09/20/2015, 5:55 PM

## 2015-09-20 NOTE — MAU Note (Signed)
Urine sent to lab 

## 2015-09-21 LAB — CBC
HCT: 31 % — ABNORMAL LOW (ref 36.0–46.0)
Hemoglobin: 10.4 g/dL — ABNORMAL LOW (ref 12.0–15.0)
MCH: 29.4 pg (ref 26.0–34.0)
MCHC: 33.5 g/dL (ref 30.0–36.0)
MCV: 87.6 fL (ref 78.0–100.0)
PLATELETS: 242 10*3/uL (ref 150–400)
RBC: 3.54 MIL/uL — ABNORMAL LOW (ref 3.87–5.11)
RDW: 13.4 % (ref 11.5–15.5)
WBC: 9.5 10*3/uL (ref 4.0–10.5)

## 2015-09-21 NOTE — Progress Notes (Signed)
Utilization review completed.  L. J. Karsen Fellows RN, BSN, CM 

## 2015-09-21 NOTE — Progress Notes (Signed)
Patient ID: Teresa Rojas, female   DOB: 04/29/1980, 35 y.o.   MRN: [id number] FACULTY PRACTICE ANTEPARTUM(COMPREHENSIVE) NOTE  Teresa Rojas is a 35 y.o. G2P1001 at 10562w6d by best clinical estimate who is admitted for bleeding previa.   Fetal presentation is unsure. Length of Stay:  1  Days  Subjective: Bleeding has slowed down Patient reports the fetal movement as active. Patient reports uterine contraction  activity as none. Patient reports  vaginal bleeding as less flow than a normal period. Patient describes fluid per vagina as None.  Vitals:  Blood pressure 94/47, pulse 79, temperature 98.9 F (37.2 C), temperature source Oral, resp. rate 18, height 5\' 7"  (1.702 m), weight 241 lb (109.317 kg), last menstrual period 03/28/2015. Physical Examination:  General appearance - alert, well appearing, and in no distress Chest - normal effort Abdomen - gravid, NT Fundal Height:  size equals dates Cervical Exam: deferred due to previa Extremities: Homans sign is negative, no sign of DVT  Membranes:intact  Fetal Monitoring:  Baseline: 145 bpm, Variability: Good {> 6 bpm), Accelerations: Non-reactive but appropriate for gestational age and Decelerations: Absent  Labs:  Results for orders placed or performed during the hospital encounter of 09/20/15 (from the past 24 hour(s))  CBC   Collection Time: 09/20/15  4:19 PM  Result Value Ref Range   WBC 9.1 4.0 - 10.5 K/uL   RBC 3.53 (L) 3.87 - 5.11 MIL/uL   Hemoglobin 10.4 (L) 12.0 - 15.0 g/dL   HCT 40.930.9 (L) 81.136.0 - 91.446.0 %   MCV 87.5 78.0 - 100.0 fL   MCH 29.5 26.0 - 34.0 pg   MCHC 33.7 30.0 - 36.0 g/dL   RDW 78.213.6 95.611.5 - 21.315.5 %   Platelets 256 150 - 400 K/uL  Type and screen [facility]   Collection Time: 09/20/15  4:19 PM  Result Value Ref Range   ABO/RH(D) O POS    Antibody Screen NEG    Sample Expiration 09/23/2015      Medications:  Scheduled . betamethasone acetate-betamethasone sodium phosphate  12 mg  Intramuscular Q24 Hr x 2  . docusate sodium  100 mg Oral Daily  . prenatal multivitamin  1 tablet Oral Q1200  . sodium chloride flush  3 mL Intravenous Q12H   I have reviewed the patient's current medications.  ASSESSMENT: Principal Problem:   Placenta previa with hemorrhage in second trimester Active Problems:   Placenta previa antepartum in second trimester   PLAN: Continue inpatient x 5-7 days Bedrest/pelvic rest Complete BMZ  Elinore Shults S, MD 09/21/2015,6:31 AM

## 2015-09-22 NOTE — Progress Notes (Signed)
Patient ID: Teresa Rojas, female   DOB: 10/28/1980, 35 y.o.   MRN: [id number] FACULTY PRACTICE ANTEPARTUM(COMPREHENSIVE) NOTE  Teresa Rojas is a 35 y.o. G2P1001 at 1730w0d by best clinical estimate who is admitted for bleeding previa.   Fetal presentation is unsure. Length of Stay:  2  Days  Subjective: Patient reports no further episodes of vaginal bleeding Patient reports the fetal movement as active. Patient reports uterine contraction  activity as none. Patient reports  vaginal bleeding as none. Patient describes fluid per vagina as None.  Vitals:  Blood pressure 90/51, pulse 83, temperature 98.8 F (37.1 C), temperature source Oral, resp. rate 18, height 5\' 7"  (1.702 m), weight 241 lb (109.317 kg), last menstrual period 03/28/2015. Physical Examination:  General appearance - alert, well appearing, and in no distress Chest - normal effort Abdomen - gravid, NT Fundal Height:  size equals dates Cervical Exam: deferred due to previa Extremities: Homans sign is negative, no sign of DVT  Membranes:intact  Fetal Monitoring:  Baseline: 140 bpm, Variability: Good {> 6 bpm), Accelerations: Non-reactive but appropriate for gestational age and Decelerations: Absent Toco: no contractions   Labs:  No results found for this or any previous visit (from the past 24 hour(s)).   Medications:  Scheduled . docusate sodium  100 mg Oral Daily  . prenatal multivitamin  1 tablet Oral Q1200  . sodium chloride flush  3 mL Intravenous Q12H   I have reviewed the patient's current medications.  ASSESSMENT: Principal Problem:   Placenta previa with hemorrhage in second trimester Active Problems:   Placenta previa antepartum in second trimester   PLAN: Continue inpatient x 5-7 days Bedrest/pelvic rest BMZ completed yesterday  Teresa Beckles, MD 09/22/2015,7:00 AM

## 2015-09-23 LAB — TYPE AND SCREEN
ABO/RH(D): O POS
ANTIBODY SCREEN: NEGATIVE

## 2015-09-23 NOTE — Progress Notes (Signed)
Patient ID: Teresa Rojas, female   DOB: 11/16/1980, 35 y.o.   MRN: [id number] ACULTY PRACTICE ANTEPARTUM COMPREHENSIVE PROGRESS NOTE  Teresa Loutatalie R Burston is a 35 y.o. G2P1001 at 7152w1d  who is admitted for bleeding in pregnancy. Posterior previa   Fetal presentation is unsure. Length of Stay:  3  Days  Subjective: Pt reports no bleeding since admission. Pt reports that her bleeding this episode was 'no different' than the bleeding she's had earlier in pregnancy.  Patient reports good fetal movement.  She reports no uterine contractions and no loss of fluid per vagina.  Vitals:  Blood pressure 107/61, pulse 75, temperature 98 F (36.7 C), temperature source Oral, resp. rate 20, height 5\' 7"  (1.702 m), weight 241 lb (109.317 kg), last menstrual period 03/28/2015. Physical Examination: General appearance - alert, well appearing, and in no distress Abdomen - soft, nontender, nondistended, no masses or organomegaly gravod Extremities - no pedal edema noted Cervical Exam: Not evaluated.  Membranes:intact  Fetal Monitoring:  Baseline: 140's bpm, Variability: Good {> 6 bpm), Accelerations: Non-reactive but appropriate for gestational age and Decelerations: Absent  Toco: no ctx  Labs:  No results found for this or any previous visit (from the past 24 hour(s)).  Imaging Studies:    None pending  Medications:  Scheduled . docusate sodium  100 mg Oral Daily  . prenatal multivitamin  1 tablet Oral Q1200  . sodium chloride flush  3 mL Intravenous Q12H   I have reviewed the patient's current medications.  ASSESSMENT: Patient Active Problem List   Diagnosis Date Noted  . Placenta previa antepartum in second trimester 09/20/2015  . Placenta previa with hemorrhage in second trimester 09/20/2015  . Placenta previa antepartum 08/16/2015  . Choroid plexus cyst of fetus on prenatal ultrasound 08/16/2015  . Second trimester bleeding 07/02/2015  . Normal pregnancy in first trimester 06/03/2015  .  Obesity in pregnancy, antepartum 06/03/2015    PLAN: Continue inpatient x 5-7 days of no bleeding Reviewed with pt increased risk of serious bleeding as the pregnancy progresses Bedrest/pelvic rest Completed BMZ 6/23 & 6/24 Continue routine antenatal care.   HARRAWAY-SMITH, Elysabeth Aust 09/23/2015,7:48 AM

## 2015-09-24 ENCOUNTER — Encounter: Payer: BLUE CROSS/BLUE SHIELD | Admitting: Obstetrics & Gynecology

## 2015-09-24 DIAGNOSIS — Z3A25 25 weeks gestation of pregnancy: Secondary | ICD-10-CM

## 2015-09-24 NOTE — Progress Notes (Addendum)
Patient ID: Teresa Rojas, female   DOB: 07/13/1980, 35 y.o.   MRN: [id number] ACULTY PRACTICE ANTEPARTUM COMPREHENSIVE PROGRESS NOTE  Teresa Loutatalie R Yust is a 35 y.o. G2P1001 at 4954w2d  who is admitted for bleeding in pregnancy. Posterior previa   Length of Stay:  4  Days  Subjective: Pt reports no bleeding since admission. Good FM, no PTL s/s  Vitals:  Blood pressure 108/54, pulse 87, temperature 98.7 F (37.1 C), temperature source Oral, resp. rate 18, height 5\' 7"  (1.702 m), weight 241 lb (109.317 kg), last menstrual period 03/28/2015. Physical Examination: General appearance - alert, well appearing, and in no distress Abdomen - gravid, nttp, soft No MRGs, normal s1 and s2 CTAB Extremities - no pedal edema noted  Fetal Monitoring:   FHTs: rNST 150 baseline, +accels, ?one variable vs maternal artifact, mod var Toco: no ctx  Labs:  Results for orders placed or performed during the hospital encounter of 09/20/15 (from the past 24 hour(s))  Type and screen [facility]   Collection Time: 09/23/15  5:50 PM  Result Value Ref Range   ABO/RH(D) O POS    Antibody Screen NEG    Sample Expiration 09/26/2015     Imaging Studies:    None pending  Medications:  Scheduled . docusate sodium  100 mg Oral Daily  . prenatal multivitamin  1 tablet Oral Q1200  . sodium chloride flush  3 mL Intravenous Q12H   I have reviewed the patient's current medications.  ASSESSMENT: Patient Active Problem List   Diagnosis Date Noted  . Placenta previa antepartum in second trimester 09/20/2015  . Placenta previa with hemorrhage in second trimester 09/20/2015  . Placenta previa antepartum 08/16/2015  . Choroid plexus cyst of fetus on prenatal ultrasound 08/16/2015  . Second trimester bleeding 07/02/2015  . Normal pregnancy in first trimester 06/03/2015  . Obesity in pregnancy, antepartum 06/03/2015    PLAN: Continue inpatient x 7 days of no bleeding Reviewed with pt increased  risk of serious bleeding as the pregnancy progresses Bedrest/pelvic rest Completed BMZ 6/23 & 6/24 Continue routine antenatal care, NSTs Will get growth scan tomorrow and likely hme on 6/29 (7d) if still w/o VB Continue with IV in place   Bishop Hillsharlie Izabelle Daus 09/24/2015,7:02 AM

## 2015-09-25 ENCOUNTER — Inpatient Hospital Stay (HOSPITAL_COMMUNITY): Payer: BLUE CROSS/BLUE SHIELD

## 2015-09-25 NOTE — Discharge Summary (Signed)
Antenatal Physician Discharge Summary  Patient ID: Teresa Rojas MRN: [id number] DOB/AGE: 34/03/1980 34 y.o.  Admit date: 09/20/2015 Discharge date: 09/26/2015  Admission Diagnoses: Pregnancy 25 weeks 5 days, vaginal bleeding, suspected known posterior placenta previa. History of bleeding throughout pregnancy  Discharge Diagnoses: Pregnancy 26 weeks 4 days not delivered                                          Posterior low-lying placenta with marginal bleeding                                          Placenta previa ruled out by ultrasound 09/25/2015                                          History of recurrent bleeding throughout pregnancy  Antenatal Unit Procedures: ultrasound, betamethasone x 2,   Intrapartum Procedures: Neonatology, Maternal Fetal Medicine consults   Significant Diagnostic Studies:  Results for orders placed or performed during the hospital encounter of 09/20/15 (from the past 168 hour(s))  CBC   Collection Time: 09/20/15  4:19 PM  Result Value Ref Range   WBC 9.1 4.0 - 10.5 K/uL   RBC 3.53 (L) 3.87 - 5.11 MIL/uL   Hemoglobin 10.4 (L) 12.0 - 15.0 g/dL   HCT 86.530.9 (L) 78.436.0 - 69.646.0 %   MCV 87.5 78.0 - 100.0 fL   MCH 29.5 26.0 - 34.0 pg   MCHC 33.7 30.0 - 36.0 g/dL   RDW 29.513.6 28.411.5 - 13.215.5 %   Platelets 256 150 - 400 K/uL  Type and screen [facility]   Collection Time: 09/20/15  4:19 PM  Result Value Ref Range   ABO/RH(D) O POS    Antibody Screen NEG    Sample Expiration 09/23/2015   CBC   Collection Time: 09/21/15  6:46 AM  Result Value Ref Range   WBC 9.5 4.0 - 10.5 K/uL   RBC 3.54 (L) 3.87 - 5.11 MIL/uL   Hemoglobin 10.4 (L) 12.0 - 15.0 g/dL   HCT 44.031.0 (L) 10.236.0 - 72.546.0 %   MCV 87.6 78.0 - 100.0 fL   MCH 29.4 26.0 - 34.0 pg   MCHC 33.5 30.0 - 36.0 g/dL   RDW 36.613.4 44.011.5 - 34.715.5 %   Platelets 242 150 - 400 K/uL  Type and screen [facility]   Collection Time: 09/23/15  5:50 PM  Result Value Ref Range   ABO/RH(D)  O POS    Antibody Screen NEG    Sample Expiration 09/26/2015     Treatments: Betamethasone x2  Hospital Course:  This is a 35 y.o. G2P1001 with IUP at 5611w3d admitted for observation due to episodes of bleeding . She was admitted with a recurrent episode of bright red blood, noted to have blood type O-positive antibody screen negative .  No leaking of fluid and no bleeding.  She was initially started on magnesium sulfate for  neuroprotection and  also received betamethasone x 2 doses.   H She was seen by  Neonatology during her stay.   She was evaluated by ultrasound on 09/25/2015 where the findings were more reassuring than anticipated.  They showed a posterior placenta well away from the internal os, no evidence of previa with the small finger of old blood reaching toward the cervix internal os.   She was deemed stable for discharge to home with [facility]  Discharge Exam: BP 112/55 mmHg  Pulse 87  Temp(Src) 98.8 F (37.1 C) (Oral)  Resp 16  Ht 5\' 7"  (1.702 m)  Wt 237 lb (107.502 kg)  BMI 37.11 kg/m2  LMP 03/28/2015 General appearance: alert, appears stated age and no distress Head: Normocephalic, without obvious abnormality, atraumatic GI: soft, non-tender; bowel sounds normal; no masses,  no organomegaly and Gravid uterus nontender Pelvic: Per transvaginal ultrasound today showed a long cervix no funneling and no evidence of previa with vertex presentation Extremities: extremities normal, atraumatic, no cyanosis or edema and Homans sign is negative, no sign of DVT  Ultrasound showed posterior placenta, over 3 cm from internal os, with  GROWTH AT 46%ILE.  Discharge Condition: Stable  Disposition: 01-Home or Self Care                       PELVIC rest ,                       [facility].     Medication List    ASK your doctor about these medications        citalopram 20 MG tablet  Commonly known as:   CELEXA  Take 20 mg by mouth daily.     cyclobenzaprine 10 MG tablet  Commonly known as:  FLEXERIL  Take 1 tablet (10 mg total) by mouth every 8 (eight) hours as needed for muscle spasms.     multivitamin-prenatal 27-0.8 MG Tabs tablet  Take 1 tablet by mouth daily at 12 noon.         SignedTilda Burrow: Teresa Rojas V M.D. 09/25/2015, 10:19 PM

## 2015-09-25 NOTE — Progress Notes (Signed)
Patient ID: Teresa Rojas, female   DOB: 06/27/1980, 35 y.o.   MRN: [id number] FACULTY PRACTICE ANTEPARTUM(COMPREHENSIVE) NOTE  Teresa Rojas is a 35 y.o. G2P1001 at 1527w3d by best clinical estimate who is admitted for bleeding previa.   Fetal presentation is unsure. Length of Stay:  5  Days  Subjective: Patient denies bleeding since admission Patient reports the fetal movement as active. Patient reports uterine contraction  activity as none. Patient reports  vaginal bleeding as none. Patient describes fluid per vagina as None.  Vitals:  Blood pressure 108/58, pulse 86, temperature 98.2 F (36.8 C), temperature source Oral, resp. rate 18, height 5\' 7"  (1.702 m), weight 241 lb (109.317 kg), last menstrual period 03/28/2015. Physical Examination:  General appearance - alert, well appearing, and in no distress Chest - normal effort Abdomen - gravid, NT Fundal Height:  size equals dates. Extremities: extremities normal, atraumatic, no cyanosis or edema  Membranes:intact  Fetal Monitoring:  Baseline: 150 bpm, Variability: Good {> 6 bpm), Accelerations: Reactive and Decelerations: Absent   Medications:  Scheduled . docusate sodium  100 mg Oral Daily  . prenatal multivitamin  1 tablet Oral Q1200  . sodium chloride flush  3 mL Intravenous Q12H   I have reviewed the patient's current medications.  ASSESSMENT: Principal Problem:   Placenta previa with hemorrhage in second trimester Active Problems:   Placenta previa antepartum in second trimester   PLAN: Increase some activity in room today in anticipation of possible d/c in am U/s to assess placenta + growth today.  Reva BoresPRATT,Adem Costlow S, MD 09/25/2015,7:13 AM

## 2015-09-25 NOTE — Progress Notes (Signed)
FACULTY PRACTICE ANTEPARTUM(COMPREHENSIVE) NOTE  Teresa Rojas is a 35 y.o. G2P1001 at 1247w3d  who is admitted for bleeding previa ultrasound today indicated that she had a posterior placenta, not previa.  There is at least 3 cm from the posterior placenta to the internal loss, with a small amount of old blood suspected to be reaching past the internal os With a small area of residual clot down toward the cervix Fetal presentation is cephalic. Length of Stay:  5  Days  Subjective: Patient here for management at home. Lengthy discussion with the patient over activity level. She wishes to be considered for return to work at some point. I recommended that she simply stay at home for now, test how she does over the next 2-3 weeks at home with exenteration of return to work if remaining stable at follow-up ultrasound in 3 weeks Patient reports the fetal movement as active. Patient reports uterine contraction  activity as none. Patient reports  vaginal bleeding as scant staining. Patient describes fluid per vagina as None.  Vitals:  Blood pressure 112/55, pulse 87, temperature 98.8 F (37.1 C), temperature source Oral, resp. rate 16, height 5\' 7"  (1.702 m), weight 237 lb (107.502 kg), last menstrual period 03/28/2015. Physical Examination:  General appearance - alert, well appearing, and in no distress, oriented to person, place, and time and overweight Heart - normal rate and regular rhythm Abdomen - soft, nontender, nondistended Fundal Height:  size equals dates Cervical Exam: Not evaluated. and found to be closed and rather long on ultrasound transvaginal earlier today C follow-up maternal-fetal medicine ultrasound dated 09/25/2015. Extremities: extremities normal, atraumatic, no cyanosis or edema and Homans sign is negative, no sign of DVT with DTRs 2+ bilaterally Membranes:intact  Fetal Monitoring:  Baseline: 150 bpm, Variability: Good {> 6 bpm) and Accelerations: Reactive  Labs:  No  results found for this or any previous visit (from the past 24 hour(s)).  Imaging Studies:     Currently EPIC will not allow sonographic studies to automatically populate into notes.  In the meantime, copy and paste results into note or free text.  Medications:  Scheduled . docusate sodium  100 mg Oral Daily  . prenatal multivitamin  1 tablet Oral Q1200  . sodium chloride flush  3 mL Intravenous Q12H   I have reviewed the patient's current medications.  ASSESSMENT: Second trimester bleeding, now not felt to be due to placenta previa but aren't no bleeding from a posterior placenta  PLAN: Patient counseled over the need for continued cautious behavior avoiding sexual activity is not return to work at this time and patient will be managed as outpatient beginning tomorrow with follow-up in 1 week with [facility]gett [facility]  Teague Goynes V 09/25/2015,10:12 PM

## 2015-09-26 MED ORDER — DOCUSATE SODIUM 100 MG PO CAPS: 100.0000 mg | ORAL_CAPSULE | Freq: Every day | ORAL | Status: DC

## 2015-10-03 ENCOUNTER — Ambulatory Visit (INDEPENDENT_AMBULATORY_CARE_PROVIDER_SITE_OTHER): Payer: 59 | Admitting: Obstetrics & Gynecology

## 2015-10-03 ENCOUNTER — Encounter: Payer: Self-pay | Admitting: Obstetrics & Gynecology

## 2015-10-03 VITALS — BP 103/70 | HR 84 | Wt 241.0 lb

## 2015-10-03 DIAGNOSIS — O283 Abnormal ultrasonic finding on antenatal screening of mother: Secondary | ICD-10-CM

## 2015-10-03 DIAGNOSIS — O350XX0 Maternal care for (suspected) central nervous system malformation in fetus, not applicable or unspecified: Secondary | ICD-10-CM

## 2015-10-03 DIAGNOSIS — E669 Obesity, unspecified: Secondary | ICD-10-CM

## 2015-10-03 DIAGNOSIS — O4402 Placenta previa specified as without hemorrhage, second trimester: Secondary | ICD-10-CM

## 2015-10-03 DIAGNOSIS — O4412 Placenta previa with hemorrhage, second trimester: Secondary | ICD-10-CM

## 2015-10-03 DIAGNOSIS — O4692 Antepartum hemorrhage, unspecified, second trimester: Secondary | ICD-10-CM

## 2015-10-03 DIAGNOSIS — IMO0002 Reserved for concepts with insufficient information to code with codable children: Secondary | ICD-10-CM

## 2015-10-03 DIAGNOSIS — O99212 Obesity complicating pregnancy, second trimester: Secondary | ICD-10-CM

## 2015-10-03 DIAGNOSIS — Z3491 Encounter for supervision of normal pregnancy, unspecified, first trimester: Secondary | ICD-10-CM

## 2015-10-03 DIAGNOSIS — Z3482 Encounter for supervision of other normal pregnancy, second trimester: Secondary | ICD-10-CM

## 2015-10-03 NOTE — Progress Notes (Signed)
Subjective:  Teresa Rojas is a 35 y.o. G2P1001 at 10062w4d being seen today for ongoing prenatal care.  She is currently monitored for the following issues for this high-risk pregnancy and has Normal pregnancy in first trimester; Obesity in pregnancy, antepartum; Second trimester bleeding; Placenta previa antepartum; and Choroid plexus cyst of fetus on prenatal ultrasound on her problem list.  Patient reports no complaints.  Contractions: Not present. Vag. Bleeding: None.  Movement: Present. Denies leaking of fluid. Pt was recently hospitalized for 7 days due to vaginal bleeding.  She was initially dx'd with a placenta previa but, a repeat sono revealed no previa and prev noted clot had almost completely resolved. Pt denies bleeding since discharge to home.  She would like to return to work.  She has a completely sedentary job.  The following portions of the patient's history were reviewed and updated as appropriate: allergies, current medications, past family history, past medical history, past social history, past surgical history and problem list. Problem list updated.  Objective:   Filed Vitals:   10/03/15 1413  BP: 103/70  Pulse: 84  Weight: 241 lb (109.317 kg)    Fetal Status: Fetal Heart Rate (bpm): 143   Movement: Present     General:  Alert, oriented and cooperative. Patient is in no acute distress.  Skin: Skin is warm and dry. No rash noted.   Cardiovascular: Normal heart rate noted  Respiratory: Normal respiratory effort, no problems with respiration noted  Abdomen: Soft, gravid, appropriate for gestational age. Pain/Pressure: Absent     Pelvic:  Cervical exam deferred        Extremities: Normal range of motion.  Edema: Trace  Mental Status: Normal mood and affect. Normal behavior. Normal judgment and thought content.   Urinalysis: Urine Protein: Trace Urine Glucose: Negative  Assessment and Plan:  Pregnancy: G2P1001 at 2962w4d  1. Normal pregnancy in first trimester Pt to  f/u in <1 week for 28 week labs.  She has her daughter with her today and is not prepared to stay  2. Obesity in pregnancy, antepartum, second trimester  3. Second trimester bleeding S/p hopstailzation from 6/23-6/28/2017 S/p BMZ No previa noted on f/u sono May RTW on sedentary activities only- reviewed no lifting, bending, stooping or prolonged standing  4. Choroid plexus cyst of fetus on prenatal ultrasound  Preterm labor symptoms and general obstetric precautions including but not limited to vaginal bleeding, contractions, leaking of fluid and fetal movement were reviewed in detail with the patient. Please refer to After Visit Summary for other counseling recommendations.  No Follow-up on file.   Willodean Rosenthalarolyn Harraway-Smith, MD

## 2015-10-07 ENCOUNTER — Encounter: Payer: Self-pay | Admitting: *Deleted

## 2015-10-09 ENCOUNTER — Other Ambulatory Visit (INDEPENDENT_AMBULATORY_CARE_PROVIDER_SITE_OTHER): Payer: 59 | Admitting: *Deleted

## 2015-10-09 DIAGNOSIS — Z3483 Encounter for supervision of other normal pregnancy, third trimester: Secondary | ICD-10-CM

## 2015-10-09 DIAGNOSIS — Z36 Encounter for antenatal screening of mother: Secondary | ICD-10-CM

## 2015-10-09 DIAGNOSIS — Z23 Encounter for immunization: Secondary | ICD-10-CM

## 2015-10-09 LAB — CBC
HCT: 31.7 % — ABNORMAL LOW (ref 35.0–45.0)
HEMOGLOBIN: 10.6 g/dL — AB (ref 11.7–15.5)
MCH: 29.9 pg (ref 27.0–33.0)
MCHC: 33.4 g/dL (ref 32.0–36.0)
MCV: 89.5 fL (ref 80.0–100.0)
MPV: 9 fL (ref 7.5–12.5)
Platelets: 259 10*3/uL (ref 140–400)
RBC: 3.54 MIL/uL — AB (ref 3.80–5.10)
RDW: 13.9 % (ref 11.0–15.0)
WBC: 7.9 10*3/uL (ref 3.8–10.8)

## 2015-10-10 ENCOUNTER — Telehealth: Payer: Self-pay | Admitting: *Deleted

## 2015-10-10 LAB — HIV ANTIBODY (ROUTINE TESTING W REFLEX): HIV 1&2 Ab, 4th Generation: NONREACTIVE

## 2015-10-10 LAB — GLUCOSE TOLERANCE, 1 HOUR (50G) W/O FASTING: Glucose, 1 Hr, gestational: 128 mg/dL

## 2015-10-10 LAB — SYPHILIS: RPR W/REFLEX TO RPR TITER AND TREPONEMAL ANTIBODIES, TRADITIONAL SCREENING AND DIAGNOSIS ALGORITHM

## 2015-10-10 NOTE — Telephone Encounter (Signed)
LM on cell phone of normal 1 hr GTT

## 2015-10-17 ENCOUNTER — Ambulatory Visit (INDEPENDENT_AMBULATORY_CARE_PROVIDER_SITE_OTHER): Payer: 59 | Admitting: Obstetrics & Gynecology

## 2015-10-17 ENCOUNTER — Encounter: Payer: Self-pay | Admitting: *Deleted

## 2015-10-17 VITALS — BP 117/74 | HR 93 | Wt 245.0 lb

## 2015-10-17 DIAGNOSIS — Z3483 Encounter for supervision of other normal pregnancy, third trimester: Secondary | ICD-10-CM

## 2015-10-17 DIAGNOSIS — Z3491 Encounter for supervision of normal pregnancy, unspecified, first trimester: Secondary | ICD-10-CM

## 2015-10-17 NOTE — Progress Notes (Signed)
Subjective:  Teresa Rojas is a 35 y.o. G2P1001 at 419w4d being seen today for ongoing prenatal care.  She is currently monitored for the following issues for this high-risk pregnancy and has Normal pregnancy in first trimester; Obesity in pregnancy, antepartum; Second trimester bleeding; Placenta previa antepartum; and Choroid plexus cyst of fetus on prenatal ultrasound on her problem list.  Patient reports no complaints.  Contractions: Irregular. Vag. Bleeding: None.  Movement: Present. Denies leaking of fluid.   The following portions of the patient's history were reviewed and updated as appropriate: allergies, current medications, past family history, past medical history, past social history, past surgical history and problem list. Problem list updated.  Objective:   Filed Vitals:   10/17/15 1559  BP: 117/74  Pulse: 93  Weight: 245 lb (111.131 kg)    Fetal Status: Fetal Heart Rate (bpm): 140 Fundal Height: 30 cm Movement: Present     General:  Alert, oriented and cooperative. Patient is in no acute distress.  Skin: Skin is warm and dry. No rash noted.   Cardiovascular: Normal heart rate noted  Respiratory: Normal respiratory effort, no problems with respiration noted  Abdomen: Soft, gravid, appropriate for gestational age. Pain/Pressure: Absent     Pelvic:  Cervical exam deferred        Extremities: Normal range of motion.  Edema: Trace  Mental Status: Normal mood and affect. Normal behavior. Normal judgment and thought content.   Urinalysis: Urine Protein: Trace Urine Glucose: Trace  Assessment and Plan:  Pregnancy: G2P1001 at 4919w4d  1.  Placental abruption Weekly visits MFM co management with US Limit activity  Preterm labor symptoms and general obstetric precautions including but not limited to vaginal bleeding, contractions, leaking of fluid and fetal movement were reviewed in detail with the patient. Please refer to After Visit Summary for other counseling  recommendations.  Return in about 1 week (around 10/24/2015).   Lesly DukesKelly H Zebediah Beezley, MD

## 2015-10-23 ENCOUNTER — Encounter (HOSPITAL_COMMUNITY): Payer: Self-pay | Admitting: *Deleted

## 2015-10-23 ENCOUNTER — Inpatient Hospital Stay (HOSPITAL_COMMUNITY): Payer: 59

## 2015-10-23 ENCOUNTER — Inpatient Hospital Stay (HOSPITAL_COMMUNITY)
Admission: AD | Admit: 2015-10-23 | Discharge: 2015-11-03 | DRG: 782 | Disposition: A | Discharge status: Home / Self Care | Payer: 59 | Source: Ambulatory Visit | Attending: Family Medicine | Admitting: Family Medicine

## 2015-10-23 DIAGNOSIS — E669 Obesity, unspecified: Secondary | ICD-10-CM | POA: Diagnosis present

## 2015-10-23 DIAGNOSIS — O4402 Placenta previa specified as without hemorrhage, second trimester: Secondary | ICD-10-CM

## 2015-10-23 DIAGNOSIS — Z6838 Body mass index (BMI) 38.0-38.9, adult: Secondary | ICD-10-CM | POA: Diagnosis not present

## 2015-10-23 DIAGNOSIS — O4692 Antepartum hemorrhage, unspecified, second trimester: Secondary | ICD-10-CM

## 2015-10-23 DIAGNOSIS — O321XX0 Maternal care for breech presentation, not applicable or unspecified: Secondary | ICD-10-CM | POA: Diagnosis present

## 2015-10-23 DIAGNOSIS — O358XX0 Maternal care for other (suspected) fetal abnormality and damage, not applicable or unspecified: Secondary | ICD-10-CM | POA: Diagnosis present

## 2015-10-23 DIAGNOSIS — O99212 Obesity complicating pregnancy, second trimester: Secondary | ICD-10-CM

## 2015-10-23 DIAGNOSIS — Z8759 Personal history of other complications of pregnancy, childbirth and the puerperium: Secondary | ICD-10-CM

## 2015-10-23 DIAGNOSIS — O4590 Premature separation of placenta, unspecified, unspecified trimester: Secondary | ICD-10-CM | POA: Diagnosis present

## 2015-10-23 DIAGNOSIS — Z3A31 31 weeks gestation of pregnancy: Secondary | ICD-10-CM | POA: Diagnosis not present

## 2015-10-23 DIAGNOSIS — O4593 Premature separation of placenta, unspecified, third trimester: Principal | ICD-10-CM | POA: Diagnosis present

## 2015-10-23 DIAGNOSIS — H9190 Unspecified hearing loss, unspecified ear: Secondary | ICD-10-CM | POA: Diagnosis present

## 2015-10-23 DIAGNOSIS — O4693 Antepartum hemorrhage, unspecified, third trimester: Secondary | ICD-10-CM | POA: Diagnosis not present

## 2015-10-23 DIAGNOSIS — Z3A30 30 weeks gestation of pregnancy: Secondary | ICD-10-CM

## 2015-10-23 DIAGNOSIS — O99213 Obesity complicating pregnancy, third trimester: Secondary | ICD-10-CM | POA: Diagnosis present

## 2015-10-23 LAB — RAPID URINE DRUG SCREEN, HOSP PERFORMED
Amphetamines: NOT DETECTED
BARBITURATES: NOT DETECTED
Benzodiazepines: NOT DETECTED
Cocaine: NOT DETECTED
Opiates: NOT DETECTED
TETRAHYDROCANNABINOL: NOT DETECTED

## 2015-10-23 LAB — CBC
HEMATOCRIT: 30.7 % — AB (ref 36.0–46.0)
HEMOGLOBIN: 10.3 g/dL — AB (ref 12.0–15.0)
MCH: 29.5 pg (ref 26.0–34.0)
MCHC: 33.6 g/dL (ref 30.0–36.0)
MCV: 88 fL (ref 78.0–100.0)
Platelets: 239 10*3/uL (ref 150–400)
RBC: 3.49 MIL/uL — ABNORMAL LOW (ref 3.87–5.11)
RDW: 13.9 % (ref 11.5–15.5)
WBC: 8.8 10*3/uL (ref 4.0–10.5)

## 2015-10-23 LAB — TYPE AND SCREEN
ABO/RH(D): O POS
Antibody Screen: NEGATIVE

## 2015-10-23 MED ORDER — PRENATAL MULTIVITAMIN CH
1.0000 | ORAL_TABLET | Freq: Every day | ORAL | Status: DC
  Filled 2015-10-23: qty 1

## 2015-10-23 MED ORDER — CITALOPRAM HYDROBROMIDE 10 MG PO TABS
10.0000 mg | ORAL_TABLET | Freq: Every day | ORAL | Status: DC
  Administered 2015-10-23 – 2015-11-02 (×11): 10 mg via ORAL
  Filled 2015-10-23 (×11): qty 1

## 2015-10-23 MED ORDER — CITALOPRAM HYDROBROMIDE 20 MG PO TABS
20.0000 mg | ORAL_TABLET | Freq: Every day | ORAL | Status: DC
  Filled 2015-10-23 (×2): qty 1

## 2015-10-23 MED ORDER — PRENATAL 27-0.8 MG PO TABS
1.0000 | ORAL_TABLET | Freq: Every day | ORAL | Status: DC
  Filled 2015-10-23: qty 1

## 2015-10-23 MED ORDER — PRENATAL MULTIVITAMIN CH
1.0000 | ORAL_TABLET | Freq: Every day | ORAL | Status: DC
  Administered 2015-10-23 – 2015-11-02 (×11): 1 via ORAL
  Filled 2015-10-23 (×10): qty 1

## 2015-10-23 MED ORDER — DOCUSATE SODIUM 100 MG PO CAPS
100.0000 mg | ORAL_CAPSULE | Freq: Every day | ORAL | Status: DC
  Administered 2015-10-23 – 2015-11-02 (×9): 100 mg via ORAL
  Filled 2015-10-23 (×10): qty 1

## 2015-10-23 MED ORDER — CALCIUM CARBONATE ANTACID 500 MG PO CHEW: 2.0000 | CHEWABLE_TABLET | ORAL | Status: DC | PRN

## 2015-10-23 MED ORDER — ZOLPIDEM TARTRATE 5 MG PO TABS
5.0000 mg | ORAL_TABLET | Freq: Every evening | ORAL | Status: DC | PRN
  Administered 2015-10-31: 5 mg via ORAL
  Filled 2015-10-23: qty 1

## 2015-10-23 MED ORDER — PRENATAL MULTIVITAMIN CH: 1.0000 | ORAL_TABLET | Freq: Every day | ORAL | Status: DC

## 2015-10-23 MED ORDER — ACETAMINOPHEN 325 MG PO TABS: 650.0000 mg | ORAL_TABLET | ORAL | Status: DC | PRN

## 2015-10-23 MED ORDER — BETAMETHASONE SOD PHOS & ACET 6 (3-3) MG/ML IJ SUSP
12.0000 mg | INTRAMUSCULAR | Status: AC
  Administered 2015-10-23 – 2015-10-24 (×2): 12 mg via INTRAMUSCULAR
  Filled 2015-10-23 (×2): qty 2

## 2015-10-23 NOTE — MAU Note (Deleted)
Patient her headache is much better (1/10 pain). Patient refused percocet and phenergan.

## 2015-10-23 NOTE — MAU Note (Signed)
PT  SAYS SHE  WAS  ASLEEP - AND  AWOKE  THINKING  SHE HAD VOIDED ON SELF - BUT  SATURATED 1 PAD,.    LAST EPISODE OF  BLEEDING   WAS IN June.    PAD IN TRIAGE -   SMALL AMT  LIGHT  PINK - RED .  NO PAIN.  HAS  HAD  SOME  UC.

## 2015-10-23 NOTE — H&P (Signed)
MAU HISTORY AND PHYSICAL  Chief Complaint:  Vaginal Bleeding   Teresa Rojas is a 35 y.o.  G2P1001  at [redacted]w[redacted]d presenting for Vaginal Bleeding . This awoke her from sleep. She saturated an overnight pad in 10 minutes. She also felt a gush prior to arrival. Patient states she has been having  none contractions, heavy vaginal bleeding, intact membranes, with active fetal movement.    Past Medical History:  Diagnosis Date  . 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 Vision loss     Past Surgical History:  Procedure Laterality Date  . WISDOM TOOTH EXTRACTION      Family History  Problem Relation Age of Onset  . Breast cancer Maternal Grandmother   . Breast cancer Paternal Grandmother     Social History  Substance Use Topics  . Smoking status: Never Smoker  . Smokeless tobacco: Never Used  . Alcohol use No    No Known Allergies  Prescriptions Prior to Admission  Medication Sig Dispense Refill Last Dose  . citalopram (CELEXA) 20 MG tablet Take 20 mg by mouth daily.  1 Taking  . cyclobenzaprine (FLEXERIL) 10 MG tablet Take 1 tablet (10 mg total) by mouth every 8 (eight) hours as needed for muscle spasms. 30 tablet 1 Taking  . docusate sodium (COLACE) 100 MG capsule Take 1 capsule (100 mg total) by mouth daily. 10 capsule 0 Taking  . Prenatal Vit-Fe Fumarate-FA (MULTIVITAMIN-PRENATAL) 27-0.8 MG TABS tablet Take 1 tablet by mouth daily at 12 noon.   Taking    Review of Systems - Negative except for what is mentioned in HPI.  Physical Exam  Blood pressure 118/66, pulse 85, temperature 97.7 F (36.5 C), temperature source Oral, resp. rate 18, height 5\' 7"  (1.702 m), weight 111 kg (244 lb 12 oz), last menstrual period 03/28/2015, unknown if currently breastfeeding. GENERAL: Well-developed, well-nourished female in no acute distress.  LUNGS: Clear to auscultation bilaterally.  HEART: Regular rate and rhythm. ABDOMEN: Soft, nontender, nondistended, gravid.  GU: gross pooling of  brb in posterior vault, cervix visually closed EXTREMITIES: Nontender, no edema, 2+ distal pulses. FHT:  Cat 1 Contractions: none   Labs: Results for orders placed or performed during the hospital encounter of 10/23/15 (from the past 24 hour(s))  CBC   Collection Time: 10/23/15  3:41 AM  Result Value Ref Range   WBC 8.8 4.0 - 10.5 K/uL   RBC 3.49 (L) 3.87 - 5.11 MIL/uL   Hemoglobin 10.3 (L) 12.0 - 15.0 g/dL   HCT 81.8 (L) 56.3 - 14.9 %   MCV 88.0 78.0 - 100.0 fL   MCH 29.5 26.0 - 34.0 pg   MCHC 33.6 30.0 - 36.0 g/dL   RDW 70.2 63.7 - 85.8 %   Platelets 239 150 - 400 K/uL    Assessment: Teresa NEDER is  35 y.o. G2P1001 at [redacted]w[redacted]d presents with Vaginal Bleeding .  Plan: Chronic abruption: admit to ante, BMZ booster, Hgb stable, continuous fetal monitoring, UDS pending, type and screen O pos, antibody neg, NICU consult  Loni Muse 7/26/20174:19 AM  Midwife attestation: I have seen and examined this patient; I agree with above documentation in the resident's note.   Teresa Rojas is a 35 y.o. G2P1001 here for vaginal bleeding  PE: BP 118/66 (BP Location: Right Arm)   Pulse 85   Temp 97.7 F (36.5 C) (Oral)   Resp 18   Ht 5\' 7"  (1.702 m)   Wt 111 kg (244  lb 12 oz)   LMP 03/28/2015   BMI 38.33 kg/m  Gen: calm comfortable, NAD Resp: normal effort, no distress Abd: gravid  ROS, labs, PMH reviewed  Plan: Admit to Ante Abruption, chronic FWB: Cat I   Donette Larry, CNM  10/23/2015, 5:20 AM

## 2015-10-24 ENCOUNTER — Encounter: Payer: Self-pay | Admitting: Obstetrics & Gynecology

## 2015-10-24 ENCOUNTER — Telehealth: Payer: Self-pay | Admitting: *Deleted

## 2015-10-24 NOTE — Progress Notes (Signed)
Patient ID: Teresa Rojas, female   DOB: 1980/05/17, 35 y.o.   MRN: [id number] [place]) NOTE  Teresa Rojas is a 35 y.o. G2P1001 at [redacted]w[redacted]d who is admitted for second episode of vaginal bleeding.  Was d/c 'd 2 wk ago Fetal presentation is breech. Length of Stay:  1  Days  Subjective: No bleeding or contractions over the last 24 hours Patient reports the fetal movement as active. Patient reports uterine contraction  activity as none. Patient reports  vaginal bleeding as none. Patient describes fluid per vagina as None.  Vitals:  Blood pressure (!) 97/42, pulse 84, temperature 98.2 F (36.8 C), resp. rate 18, height '5\' 7"'$  (1.702 m), weight 111 kg (244 lb 12 oz), last menstrual period 03/28/2015, unknown if currently breastfeeding. Physical Examination:  General appearance - alert, well appearing, and in no distress, oriented to person, place, and time and cheerful Heart - normal rate and regular rhythm Abdomen - soft, nontender, nondistended Fundal Height:  size equals dates Cervical Exam: Not evaluated. And Extremities: extremities normal, atraumatic, no cyanosis or edema and Homans sign is negative, no sign of DVT with DTRs 2+ bilaterally Membranes:intact  Fetal Monitoring:  135 , reactive criteria met  Labs:  No results found for this or any previous visit (from the past 24 hour(s)).  Imaging Studies:     Currently EPIC will not allow sonographic studies to automatically populate into notes.  In the meantime, copy and paste results into note or free text.  Medications:  Scheduled . citalopram  10 mg Oral QHS  . docusate sodium  100 mg Oral Daily  . prenatal multivitamin  1 tablet Oral QHS   I have reviewed the patient's current medications.  ASSESSMENT: Patient Active Problem List   Diagnosis Date Noted  . Placental abruption 10/23/2015  . Choroid plexus cyst of fetus on prenatal ultrasound 08/16/2015  . Second trimester bleeding  07/02/2015  . Normal pregnancy in first trimester 06/03/2015  . Obesity in pregnancy, antepartum 06/03/2015    PLAN: pregn 30w4,  Placental abruption Large venous lakes up to 1.4 cm diameter in lower ut segment well visualized on limited u/s 7/26 Continued bedrest + brp, with growth u/s scheduled for 8/2 next week. Continued inpt care with iv access, type and cross in place. Juwan Vences V 10/24/2015,7:04 AM

## 2015-10-25 ENCOUNTER — Inpatient Hospital Stay (HOSPITAL_COMMUNITY): Payer: 59

## 2015-10-25 ENCOUNTER — Encounter: Payer: 59 | Admitting: Family

## 2015-10-25 NOTE — Consult Note (Signed)
[facility]  Prenatal Consult       10/25/2015  11:49 AM  I was asked by Dr. Jolayne Panther to consult on this patient for possible preterm delivery.  I had the pleasure of meeting with Teresa Rojas today.  She is a 35 y/o G2P1001 at 44 and 5/[redacted] weeks gestation who was admitted on 7/26 for a second episode of vaginal bleeding, thought to be due to chronic abruption.  She received BMZ on 7/26 and 7/27.  She also had a corse on 6/23 and 6/24 for her first episode of bleeding.  The fetus is a female.    I explained that the neonatal intensive care team would be present for the delivery and outlined the likely delivery room course for this baby including routine resuscitation and NRP-guided approaches to the treatment of respiratory distress. We discussed other common problems associated with prematurity including respiratory distress syndrome/CLD, apnea, feeding issues, temperature regulation, and infection risk.  We briefly discussed IVH/PVL, ROP, and NEC and that these are complications associated with prematurity, but that by 30 weeks are uncommon.    We discussed the average length of stay but I noted that the actual LOS would depend on the severity of problems encountered and response to treatments.  We discussed visitation policies and the resources available while her child is in the hospital.  We discussed the importance of good nutrition and various methods of providing nutrition (parenteral hyperalimentation, gavage feedings and/or oral feeding). We discussed the benefits of human milk. I encouraged breast feeding and pumping soon after birth and outlined resources that are available to support breast feeding.   Thank you for involving [place] in the care of this patient. A member of our team will be available should the family have additional questions.  Time for consultation approximately 45 minutes.   _____________________ Electronically Signed By: Teresa Char,  MD Neonatologist

## 2015-10-25 NOTE — Progress Notes (Signed)
Patient ID: Teresa Rojas, female   DOB: [birth year], 34 y.o.   MRN: [id number] FACULTY PRACTICE ANTEPARTUM(COMPREHENSIVE) NOTE  Teresa Rojas is a 35 y.o. G2P1001 at 103w5d  who is admitted for second episode of vaginal bleeding.  Was d/c 'd 2 wk ago Fetal presentation is breech. Length of Stay:  2  Days  Subjective: Patient is doing well and denies any further episodes of vaginal bleeding. She is in good spirits this morning Patient reports the fetal movement as active. Patient reports uterine contraction  activity as none. Patient reports  vaginal bleeding as none. Patient describes fluid per vagina as None.  Vitals:  Blood pressure 123/68, pulse 81, temperature 98.1 F (36.7 C), temperature source Oral, resp. rate 18, height 5\' 7"  (1.702 m), weight 244 lb 12 oz (111 kg), last menstrual period 03/28/2015, unknown if currently breastfeeding. Physical Examination:  General appearance - alert, well appearing, and in no distress, oriented to person, place, and time and cheerful Heart - normal rate and regular rhythm Abdomen - soft, nontender, nondistended Fundal Height:  size equals dates Cervical Exam: Not evaluated.  Extremities: extremities normal, atraumatic, no cyanosis or edema and Homans sign is negative, no sign of DVT with DTRs 2+ bilaterally Membranes:intact  Fetal Monitoring:  140 , mod variability, no accels or decels. Appropriate for gestational age  Labs:  No results found for this or any previous visit (from the past 24 hour(s)).  Imaging Studies:     Currently EPIC will not allow sonographic studies to automatically populate into notes.  In the meantime, copy and paste results into note or free text.  Medications:  Scheduled . citalopram  10 mg Oral QHS  . docusate sodium  100 mg Oral Daily  . prenatal multivitamin  1 tablet Oral QHS   I have reviewed the patient's current medications.  ASSESSMENT: Patient Active Problem List   Diagnosis Date Noted  .  Placental abruption 10/23/2015  . Choroid plexus cyst of fetus on prenatal ultrasound 08/16/2015  . Second trimester bleeding 07/02/2015  . Normal pregnancy in first trimester 06/03/2015  . Obesity in pregnancy, antepartum 06/03/2015    PLAN: 35 yo G2P1001 at [redacted]w[redacted]d with placental abruption - Will obtain ultrasound as recommended by MFM - Continue close monitoring - Reassuring fetal status - Growth ultrasound scheduled on 8/2  Anzleigh Slaven 10/25/2015,7:58 AM

## 2015-10-25 NOTE — Telephone Encounter (Signed)
Erroneous encounter

## 2015-10-25 NOTE — Progress Notes (Signed)
To ultrasound via wheelchair.

## 2015-10-26 DIAGNOSIS — Z3A30 30 weeks gestation of pregnancy: Secondary | ICD-10-CM

## 2015-10-26 DIAGNOSIS — O4593 Premature separation of placenta, unspecified, third trimester: Principal | ICD-10-CM

## 2015-10-26 MED ORDER — SODIUM CHLORIDE 0.9% FLUSH
3.0000 mL | Freq: Two times a day (BID) | INTRAVENOUS | Status: DC
  Administered 2015-10-26 – 2015-11-02 (×15): 3 mL via INTRAVENOUS

## 2015-10-26 NOTE — Progress Notes (Signed)
Patient ID: Teresa Rojas, female   DOB: 1980-12-12, 35 y.o.   MRN: [id number] FACULTY PRACTICE ANTEPARTUM(COMPREHENSIVE) NOTE  Teresa Rojas is a 35 y.o. G2P1001 at [redacted]w[redacted]d who is admitted for second episode of vaginal bleeding.  Was discharged 2 weeks ago Fetal presentation is breech. Length of Stay:  3  Days  Subjective: Patient is doing well and denies any further episodes of vaginal bleeding. She is in good spirits this morning Patient reports the fetal movement as active. Patient reports uterine contraction  activity as none. Patient reports  vaginal bleeding as none. Patient describes fluid per vagina as None.  Vitals:  Blood pressure 104/69, pulse 77, temperature 97.7 F (36.5 C), resp. rate 20, height 5\' 7"  (1.702 m), weight 244 lb 12 oz (111 kg), last menstrual period 03/28/2015, unknown if currently breastfeeding. Physical Examination: General appearance - alert, well appearing, and in no distress, oriented to person, place, and time and cheerful Heart - normal rate and regular rhythm Abdomen - soft, nontender, nondistended Fundal Height:  size equals dates Cervical Exam: Not evaluated.  Extremities: extremities normal, atraumatic, no cyanosis or edema and Homans sign is negative, no sign of DVT with DTRs 2+ bilaterally Membranes:intact  Fetal Monitoring:  140 , mod variability, no accels or decels. Appropriate for gestational age  Labs: No results found for this or any previous visit (from the past 24 hour(s)).  Imaging Studies:      Medications:  Scheduled . citalopram  10 mg Oral QHS  . docusate sodium  100 mg Oral Daily  . prenatal multivitamin  1 tablet Oral QHS   I have reviewed the patient's current medications.  ASSESSMENT: Patient Active Problem List   Diagnosis Date Noted  . Placental abruption 10/23/2015  . Choroid plexus cyst of fetus on prenatal ultrasound 08/16/2015  . Second trimester bleeding 07/02/2015  . Normal pregnancy in first trimester  06/03/2015  . Obesity in pregnancy, antepartum 06/03/2015    PLAN: 35 yo G2P1001 at [redacted]w[redacted]d with placental abruption - Plan is to observe until 7 days without bleeding - Continue close monitoring - Reassuring fetal status  Kris Burd A, MD 10/26/2015,7:36 AM

## 2015-10-27 DIAGNOSIS — Z3A31 31 weeks gestation of pregnancy: Secondary | ICD-10-CM

## 2015-10-27 NOTE — Progress Notes (Addendum)
Patient ID: Teresa Rojas, female   DOB: [birth year], 35 y.o.   MRN: [id number] FACULTY PRACTICE ANTEPARTUM(COMPREHENSIVE) NOTE  Teresa Rojas is a 35 y.o. G2P1001 at [redacted]w[redacted]d who is admitted for second episode of vaginal bleeding.  Was discharged 2 weeks ago Fetal presentation is breech. Length of Stay:  4  Days  Subjective: Patient is doing well.  No further episodes of vaginal bleeding.  Patient reports the fetal movement as active. Patient reports uterine contraction  activity as none. Patient reports  vaginal bleeding as none. Patient describes fluid per vagina as None.  Vitals:  Blood pressure (!) 109/59, pulse 78, temperature 98.3 F (36.8 C), temperature source Oral, resp. rate 18, height 5\' 7"  (1.702 m), weight 244 lb 12 oz (111 kg), last menstrual period 03/28/2015, unknown if currently breastfeeding. Physical Examination: General appearance - alert, well appearing, no distress, oriented x3. cheerful Heart - normal rate and regular rhythm Lungs - CTA bilat. Abdomen - soft, nontender, nondistended Fundal Height:  size equals dates Cervical Exam: Not evaluated.  Extremities: extremities normal, atraumatic, no cyanosis or edema and Homans sign is negative, no sign of DVT with DTRs 2+ bilaterally Membranes:intact  Fetal Monitoring:  Two NSTs reviewed: 140 , mod variability, no accels or decels. Appropriate for gestational age  Labs: No results found for this or any previous visit (from the past 24 hour(s)).  Imaging Studies:   none   Medications:  Scheduled . citalopram  10 mg Oral QHS  . docusate sodium  100 mg Oral Daily  . prenatal multivitamin  1 tablet Oral QHS  . sodium chloride flush  3 mL Intravenous Q12H   I have reviewed the patient's current medications.  ASSESSMENT: Patient Active Problem List   Diagnosis Date Noted  . Placental abruption 10/23/2015  . Choroid plexus cyst of fetus on prenatal ultrasound 08/16/2015  . Second trimester bleeding 07/02/2015   . Normal pregnancy in first trimester 06/03/2015  . Obesity in pregnancy, antepartum 06/03/2015    PLAN: 1.  Placental Abruption  Continue inpatient monitoring until 7 days without bleeding  Twice daily NST 2.  31 week pregnancy  Reassuring fetal status  Levie Heritage, DO 10/27/2015,6:33 AM

## 2015-10-27 NOTE — Progress Notes (Signed)
Patient called RN to come into room for newly reported  bleeding. RN assessed pad to find a quarter-size spot of bright red blood. Patient also reported some blood when she wiped after using the restroom. She stated "This is how it usually starts when it happens." RN instructed patient to call again if bleeding increased or changed in any way. Patient verbalized understanding. Leanord Thibeau [place], [place] 10/27/2015 1315

## 2015-10-28 DIAGNOSIS — O4693 Antepartum hemorrhage, unspecified, third trimester: Secondary | ICD-10-CM

## 2015-10-28 NOTE — Progress Notes (Signed)
Patient ID: Teresa Rojas, female   DOB: 07-Jun-1980, 35 y.o.   MRN: [id number] FACULTY PRACTICE ANTEPARTUM(COMPREHENSIVE) NOTE  Teresa Rojas is a 35 y.o. G2P1001 at [redacted]w[redacted]d by best clinical estimate who is admitted for third trimester bleeding.   Fetal presentation is unsure. Length of Stay:  5  Days  Subjective: No longer bleeding. Just spotting. Patient reports the fetal movement as active. Patient reports uterine contraction  activity as none. Patient reports  vaginal bleeding as scant staining. Patient describes fluid per vagina as None.  Vitals:  Blood pressure (!) 108/45, pulse 86, temperature 98.4 F (36.9 C), resp. rate 18, height 5\' 7"  (1.702 m), weight 244 lb 12 oz (111 kg), last menstrual period 03/28/2015, unknown if currently breastfeeding. Physical Examination:  General appearance - alert, well appearing, and in no distress Chest - normal effort Abdomen - gravid, NT Fundal Height:  size equals dates Extremities: Homans sign is negative, no sign of DVT  Membranes:intact  Fetal Monitoring:  Baseline: 150 bpm, Variability: Good {> 6 bpm), Accelerations: Reactive and Decelerations: Absent  Medications:  Scheduled . citalopram  10 mg Oral QHS  . docusate sodium  100 mg Oral Daily  . prenatal multivitamin  1 tablet Oral QHS  . sodium chloride flush  3 mL Intravenous Q12H   I have reviewed the patient's current medications.  ASSESSMENT: Active Problems:   Placental abruption   PLAN: Less bleeding and improved u/s Plan admission x 5-7 days post last bleed, then d/c if stable.  Reva Bores, MD 10/28/2015,7:28 AM

## 2015-10-29 NOTE — Progress Notes (Signed)
Patient ID: Teresa Rojas, female   DOB: 1980/04/02, 35 y.o.   MRN: [id number] FACULTY PRACTICE ANTEPARTUM(COMPREHENSIVE) NOTE  GERIANNE VANESSEN is a 35 y.o. G2P1001 at [redacted]w[redacted]d by best clinical estimate who is admitted for third trimester bleeding.   Fetal presentation is unsure. Length of Stay:  6  Days  Subjective: Patient denies any further episodes of vaginal bleeding. Patient reports the fetal movement as active. Patient reports uterine contraction  activity as none. Patient reports  vaginal bleeding as scant staining. Patient describes fluid per vagina as None.  Vitals:  Blood pressure 108/70, pulse 77, temperature 98 F (36.7 C), temperature source Oral, resp. rate 18, height 5\' 7"  (1.702 m), weight 244 lb 12 oz (111 kg), last menstrual period 03/28/2015, unknown if currently breastfeeding. Physical Examination:  General appearance - alert, well appearing, and in no distress Chest - normal effort Abdomen - gravid, NT Fundal Height:  size equals dates Extremities: Homans sign is negative, no sign of DVT  Membranes:intact  Fetal Monitoring:  Baseline: 135 bpm, Variability: Good {> 6 bpm), Accelerations: Reactive and Decelerations: Absent  Toco: no contractions  Medications:  Scheduled . citalopram  10 mg Oral QHS  . docusate sodium  100 mg Oral Daily  . prenatal multivitamin  1 tablet Oral QHS  . sodium chloride flush  3 mL Intravenous Q12H   I have reviewed the patient's current medications.  ASSESSMENT: Active Problems:   Placental abruption   PLAN: Less bleeding and improved u/s Plan admission x 5-7 days post last bleed, Advised to increase activity level around the hospital D/C planning on Thursday (day 7) Continue antepartum care  Duilio Heritage, MD 10/29/2015,6:17 AM

## 2015-10-30 ENCOUNTER — Ambulatory Visit (HOSPITAL_COMMUNITY): Payer: BLUE CROSS/BLUE SHIELD

## 2015-10-30 NOTE — Progress Notes (Signed)
FACULTY PRACTICE ANTEPARTUM(COMPREHENSIVE) NOTE  Teresa Rojas is a 35 y.o. G2P1001 at 79w3dwho is admitted for vaginal bleeding.   Fetal presentation is breech. Length of Stay:  7  Days  Subjective: Only brown spotting Patient reports the fetal movement as active. Patient reports uterine contraction  activity as none. Patient reports  vaginal bleeding as scant staining. Patient describes fluid per vagina as None.  Vitals:  Blood pressure 100/71, pulse 88, temperature 98.4 F (36.9 C), temperature source Oral, resp. rate 18, height 5\' 7"  (1.702 m), weight 111 kg (244 lb 12 oz), last menstrual period 03/28/2015, unknown if currently breastfeeding. Physical Examination:  General appearance - alert, well appearing, and in no distress Heart - normal rate and regular rhythm Abdomen - soft, nontender, nondistended Fundal Height:  size equals dates Cervical Exam: Not evaluated.  Extremities: extremities normal, atraumatic, no cyanosis or edema and Homans sign is negative, no sign of DVT  Membranes:intact  Fetal Monitoring:   Fetal Heart Rate A  Mode -- [off] filed at 10/29/2015 2300  Baseline Rate (A) 145 bpm filed at 10/29/2015 2216  Variability 6-25 BPM filed at 10/29/2015 2216  Accelerations 15 x 15 filed at 10/29/2015 2216  Decelerations None filed at 10/29/2015 2216     Labs:  No results found for this or any previous visit (from the past 24 hour(s)).    Medications:  Scheduled . citalopram  10 mg Oral QHS  . docusate sodium  100 mg Oral Daily  . prenatal multivitamin  1 tablet Oral QHS  . sodium chloride flush  3 mL Intravenous Q12H   I have reviewed the patient's current medications.  ASSESSMENT: Patient Active Problem List   Diagnosis Date Noted  . Placental abruption 10/23/2015  . Choroid plexus cyst of fetus on prenatal ultrasound 08/16/2015  . Second trimester bleeding 07/02/2015  . Normal pregnancy in first trimester 06/03/2015  . Obesity in pregnancy,  antepartum 06/03/2015    PLAN: Watch for recurrent vaginal bleeding  ARNOLD,JAMES 10/30/2015,7:30 AM

## 2015-10-31 ENCOUNTER — Encounter: Payer: 59 | Admitting: Obstetrics & Gynecology

## 2015-10-31 NOTE — Progress Notes (Signed)
Patient ID: [name], female   DOB: 07-04-1980, 35 y.o.   MRN: [id number] FACULTY PRACTICE ANTEPARTUM(COMPREHENSIVE) NOTE  Teresa Rojas is a 35 y.o. G2P1001 at [redacted]w[redacted]d  who is admitted for Chronic abruption, with large venous lakes on u/s that appear near lower ut segment on last u/s.   Fetal presentation is breech. Length of Stay:  8  Days  Subjective: Pt bled last episode on 7/30, Sunday, and has no further bleeding since. Patient reports the fetal movement as active. Patient reports uterine contraction  activity as none. Patient reports  vaginal bleeding as none. None since sunday Patient describes fluid per vagina as None.  Vitals:  Blood pressure (!) 104/59, pulse 88, temperature 98.4 F (36.9 C), temperature source Oral, resp. rate 18, height 5\' 7"  (1.702 m), weight 110.2 kg (243 lb), last menstrual period 03/28/2015, unknown if currently breastfeeding. Physical Examination:  General appearance - alert, well appearing, and in no distress, oriented to person, place, and time and overweight, good spirit Heart - normal rate and regular rhythm Abdomen - soft, nontender, nondistended Fundal Height:  size equals dates Cervical Exam: Not evaluated. and  . Extremities: extremities normal, atraumatic, no cyanosis or edema and Homans sign is negative, no sign of DVT with DTRs 2+ bilaterally Membranes:intact  Fetal Monitoring:  Baseline: 145 bpm, Variability: Good {> 6 bpm), Accelerations: Reactive, Decelerations: Absent and no contractions  Labs:  No results found for this or any previous visit (from the past 24 hour(s)).  Imaging Studies:     Cu  Medications:  Scheduled . citalopram  10 mg Oral QHS  . docusate sodium  100 mg Oral Daily  . prenatal multivitamin  1 tablet Oral QHS  . sodium chloride flush  3 mL Intravenous Q12H   I have reviewed the patient's current medications.  ASSESSMENT: Patient Active Problem List   Diagnosis Date Noted  . Placental abruption  10/23/2015  . Choroid plexus cyst of fetus on prenatal ultrasound 08/16/2015  . Second trimester bleeding 07/02/2015  . Normal pregnancy in first trimester 06/03/2015  . Obesity in pregnancy, antepartum 06/03/2015    PLAN: Continued inpt status til 7 days without bleeding.(sunday) Consider out pt care when 7 days stable. Since this is a repeat admission for bleeding, discuss Friday the possibility of prolonged inpt status.  [name] V 10/31/2015,7:41 AM

## 2015-10-31 NOTE — Progress Notes (Signed)
Initial Nutrition Assessment  DOCUMENTATION CODES:  Obesity unspecified  INTERVENTION:  Regular diet/ retail and snack menus offered  NUTRITION DIAGNOSIS:   Increased nutrient needs related to  (pregnancy and fetal growth requirements) as evidenced by  (31 weeks IUP).  GOAL:  Patient will meet greater than or equal to 90% of their needs  MONITOR:  Weight trends  REASON FOR ASSESSMENT:  Antenatal   ASSESSMENT:   31 4/7 weeks, chronic abruption. 231 lbs at initial prenatl visit, BMI 36.2, weight gain of 12 lbs  Appetite good, no nausea reported  Diet Order:  Diet regular Room service appropriate? Yes; Fluid consistency: Thin  Skin:  Reviewed, no issues  Height:   Ht Readings from Last 1 Encounters:  10/23/15 5\' 7"  (1.702 m)   Weight:   Wt Readings from Last 1 Encounters:  10/30/15 243 lb (110.2 kg)   Ideal Body Weight:    135 lbs  BMI:  Body mass index is 38.06 kg/m.  Estimated Nutritional Needs:   Kcal:  2300-2500  Protein:  100-110 g  Fluid:  2.6 L  EDUCATION NEEDS:   No education needs identified at this time  Inez Pilgrim.Odis Luster LDN Neonatal Nutrition Support Specialist/RD III Pager [phone]      Phone [phone]

## 2015-11-01 LAB — TYPE AND SCREEN
ABO/RH(D): O POS
ANTIBODY SCREEN: NEGATIVE

## 2015-11-01 NOTE — Progress Notes (Signed)
Patient ID: Teresa Rojas, female   DOB: [birth year], 35 y.o.   MRN: [id number] FACULTY PRACTICE ANTEPARTUM(COMPREHENSIVE) NOTE  Teresa Rojas is a 35 y.o. G2P1001 at [redacted]w[redacted]d who is admitted for second episode of vaginal bleeding.  Was discharged 2 weeks ago Fetal presentation is breech. Length of Stay:  9  Days  Subjective: Last bleed 7/30.  Patient is doing well.  No further episodes of vaginal bleeding, just brownish discharge.  Patient reports the fetal movement as active. Patient reports uterine contraction  activity as none. Patient reports  vaginal bleeding as none. Patient describes fluid per vagina as None.  Vitals:  Blood pressure 108/71, pulse 95, temperature 97.8 F (36.6 C), temperature source Oral, resp. rate 18, height 5\' 7"  (1.702 m), weight 243 lb (110.2 kg), last menstrual period 03/28/2015, unknown if currently breastfeeding. Physical Examination: General appearance - alert, well appearing, no distress, oriented x3. cheerful Heart - normal rate and regular rhythm Lungs - CTA bilat. Abdomen - soft, nontender, nondistended Fundal Height:  size equals dates Cervical Exam: Not evaluated.  Extremities: extremities normal, atraumatic, no cyanosis or edema and Homans sign is negative, no sign of DVT with DTRs 2+ bilaterally Membranes:intact  Fetal Monitoring:  Two NSTs reviewed: 140 , mod variability, no accels or decels. Appropriate for gestational age  Labs: No results found for this or any previous visit (from the past 24 hour(s)).  Imaging Studies:   none   Medications:  Scheduled . citalopram  10 mg Oral QHS  . docusate sodium  100 mg Oral Daily  . prenatal multivitamin  1 tablet Oral QHS  . sodium chloride flush  3 mL Intravenous Q12H   I have reviewed the patient's current medications.  ASSESSMENT: Patient Active Problem List   Diagnosis Date Noted  . Placental abruption 10/23/2015  . Choroid plexus cyst of fetus on prenatal ultrasound 08/16/2015  .  Second trimester bleeding 07/02/2015  . Normal pregnancy in first trimester 06/03/2015  . Obesity in pregnancy, antepartum 06/03/2015    PLAN: 1.  Placental Abruption  Continue inpatient monitoring until 7 days without bleeding  Twice daily NST 2.  31 week pregnancy  Reassuring fetal status  Levie Heritage, DO 11/01/2015,7:33 AM

## 2015-11-02 NOTE — Progress Notes (Signed)
Patient ID: Teresa Rojas, female   DOB: 1980-04-14, 35 y.o.   MRN: [id number] Patient ID: Teresa Rojas, female   DOB: 15-Sep-1980, 35 y.o.   MRN: [id number] FACULTY PRACTICE ANTEPARTUM(COMPREHENSIVE) NOTE  Teresa Rojas is a 35 y.o. G2P1001 at [redacted]w[redacted]d who is admitted for second episode of vaginal bleeding.  Was discharged 2 weeks ago Fetal presentation is breech. Length of Stay:  10  Days  Subjective: Last bleed 7/30.  Patient is doing well.  No further episodes of vaginal bleedingPatient reports the fetal movement as active. Patient reports uterine contraction  activity as none. Patient reports  vaginal bleeding as none. Patient describes fluid per vagina as None.  Vitals:  Blood pressure 105/61, pulse 99, temperature 98.1 F (36.7 C), temperature source Oral, resp. rate 18, height 5\' 7"  (1.702 m), weight 243 lb (110.2 kg), last menstrual period 03/28/2015, unknown if currently breastfeeding. Physical Examination: General appearance - alert, well appearing, no distress, oriented x3. cheerful Heart - normal rate and regular rhythm Lungs - CTA bilat. Abdomen - soft, nontender, nondistended Fundal Height:  size equals dates Cervical Exam: Not evaluated.  Extremities: extremities normal, atraumatic, no cyanosis or edema and Homans sign is negative, no sign of DVT with DTRs 2+ bilaterally Membranes:intact  Fetal Monitoring:  Two NSTs reviewed: 140 , mod variability, no accels or decels. Appropriate for gestational age  Labs:  Results for orders placed or performed during the hospital encounter of 10/23/15 (from the past 24 hour(s))  Type and screen [facility]   Collection Time: 11/01/15  7:41 PM  Result Value Ref Range   ABO/RH(D) O POS    Antibody Screen NEG    Sample Expiration 11/04/2015     Imaging Studies:   none   Medications:  Scheduled . citalopram  10 mg Oral QHS  . docusate sodium  100 mg Oral Daily  . prenatal multivitamin  1 tablet Oral QHS   . sodium chloride flush  3 mL Intravenous Q12H   I have reviewed the patient's current medications.  ASSESSMENT: Patient Active Problem List   Diagnosis Date Noted  . Antepartum placental abruption 10/23/2015  . Choroid plexus cyst of fetus on prenatal ultrasound 08/16/2015  . Second trimester bleeding 07/02/2015  . Normal pregnancy in first trimester 06/03/2015  . Obesity in pregnancy, antepartum 06/03/2015    PLAN: 1.  Placental Abruption  Continue inpatient monitoring until 7 days without bleeding which would be tomorrow  Twice daily NST 2.  [redacted]w[redacted]d  week pregnancy  Reassuring fetal status  Lazaro Arms, MD 11/02/2015,7:49 AM

## 2015-11-03 NOTE — Discharge Instructions (Signed)
Placental Abruption  Your placenta is the organ that nourishes your unborn baby (fetus). Your baby gets his or her blood supply and nutrients through your placenta. It is your baby's life support system. It is attached to the inside of your uterus until after your baby is born.   Placental abruption is when the placenta partly or completely separates from the uterus before your baby is born. This is rare, but it can happen any time after 20 weeks of pregnancy. A small separation may not cause problems, but a large separation may be dangerous for you and your baby.  CAUSES   Most of the time the cause of a placental abruption is unknown. Though it is rare, a placental abruption can be caused by:    An abdominal injury.    The baby turning from a buttocks-first position (breech presentation) or a sideways position (transverse) to a headfirst position (cephalic).    Delivering the first of multiple babies (twins, triplets, or more).    Sudden loss of amniotic fluid (premature rupture of the membranes).    An abnormally short umbilical cord.  RISK FACTORS  Some risk factors make a placental abruption more likely, including:   History of placental abruption.   High blood pressure (hypertension).   Smoking.   Alcohol intake.   Blood clotting problems.   Too much amniotic fluid.   Having had multiples (twins or triplets or more).   Seizures and convulsions.   Diabetes mellitus.   Having had more than four children.   Age 35 years or older.   Illegal drug use.   Injury to your abdomen.  SIGNS AND SYMPTOMS   A small placental abruption may not cause symptoms. If you do have symptoms, they may include:   Mild abdominal pain.   Slight vaginal bleeding.  Symptoms of severe placental abruption depend on the size of the separation and the stage of pregnancy. Symptoms may include:    Sudden pain in your uterus.   Abdominal pain.   Vaginal bleeding.   Tender uterus.   Severe abdominal pain with  tenderness.   Continual contractions of your uterus.   Back pain.   Weakness, light-headedness.  DIAGNOSIS   Placental abruption is suspected when a pregnant woman develops sudden pain in her uterus. The health care provider will check whether the uterus is very tender, hard, and enlarging and whether the baby has an abnormal heart rate or rhythm. Ultrasonography (commonly called an ultrasound) will be done. Blood work will also be done to make sure that there are enough healthy red blood cells and that there are no clotting problems or signs of too much blood loss.  TREATMENT   Placental abruption is usually an emergency. It requires treatment right away. Your treatment will depend on:    The amount of bleeding.   Whether you or you baby are in distress.   The stage of your pregnancy.   The maturity of the baby.  Treatment for partial separation of the placenta is bed rest and close observation. You also may need a blood transfusion or to receive fluids through an IV tube. Treatment for complete placental separation is delivery of your baby. You may have a cesarean delivery if your baby is in distress.  HOME CARE INSTRUCTIONS    Only take medicines as directed by your health care provider.   Arrange for help at home before and after you deliver the baby, especially if you had a cesarean delivery or   lost a lot of blood.   Get plenty of rest and sleep.   Do not have sexual intercourse until your health care provider says it is okay.   Do not use tampons or douche unless your health care provider says it is okay.  SEEK MEDICAL CARE IF:   You have light vaginal bleeding or spotting.   You have any type of trauma, such as a fall or jolt during an accident.   You are having trouble avoiding drugs, alcohol, or smoking.  SEEK IMMEDIATE MEDICAL CARE IF:   You have vaginal bleeding.   You have abdominal pain.   You have continuous uterine contractions.   You have a hard, tender uterus.   You do not feel  the baby move, or the baby moves very little.  MAKE SURE YOU:   Understand these instructions.   Will watch your condition.   Will get help right away if you are not doing well or get worse.     This information is not intended to replace advice given to you by your health care provider. Make sure you discuss any questions you have with your health care provider.     Document Released: 03/16/2005 Document Revised: 03/21/2013 Document Reviewed: 01/06/2013  Elsevier Interactive Patient Education 2016 [employer]

## 2015-11-03 NOTE — Discharge Summary (Signed)
Antenatal Physician Discharge Summary  Patient ID: Teresa Rojas MRN: [id number] DOB/AGE: 1980-11-23 35 y.o.  Admit date: 10/23/2015 Discharge date: 11/03/2015  Admission Diagnoses:Principal Problem:   Antepartum placental abruption   Discharge Diagnoses: Same  Prenatal Procedures: NST and ultrasound  Consults: Neonatology, Maternal Fetal Medicine  Hospital Course:  This is a 35 y.o. G2P1001 with IUP at [redacted]w[redacted]d admitted for vaginal bleeding on 7/26. Had previous previa, which had resolved and thought to have a placental abruption. She was observed, fetal heart rate monitoring remained reassuring, and she had no signs/symptoms of progressing preterm labor or other maternal-fetal concerns. She stayed for 7 days with no further bleeding.  She was deemed stable for discharge to home with outpatient follow up.  Discharge Exam: Temp:  [98 F (36.7 C)-98.4 F (36.9 C)] 98.4 F (36.9 C) (08/05 2023) Pulse Rate:  [84-119] 119 (08/05 2023) Resp:  [18-20] 18 (08/05 2023) BP: (100-112)/(59-72) 107/72 (08/05 2023) Physical Examination: CONSTITUTIONAL: Well-developed, well-nourished female in no acute distress.  NECK: Normal range of motion, supple, no masses SKIN: Skin is warm and dry. No rash noted. Not diaphoretic. No erythema. No pallor. NEUROLGIC: Alert and oriented to person, place, and time. Normal reflexes, muscle tone coordination. No cranial nerve deficit noted. CARDIOVASCULAR: Normal heart rate noted, regular rhythm RESPIRATORY: Effort and breath sounds normal, no problems with respiration noted ABDOMEN: Soft, nontender, nondistended, gravid.  Fetal monitoring: FHR: 145 bpm, Variability: moderate, Accelerations: Present, Decelerations: Absent  Uterine activity: 0 contractions per hour  Significant Diagnostic Studies:  Results for orders placed or performed during the hospital encounter of 10/23/15 (from the past 168 hour(s))  Type and screen [facility] [place]   Collection Time: 11/01/15  7:41 PM  Result Value Ref Range   ABO/RH(D) O POS    Antibody Screen NEG    Sample Expiration 11/04/2015     Discharge Condition: Stable  Disposition: 82-Discharged to home/self-care with a planned acute care hospital inpt readmission 35   Discharge Instructions    Discharge activity:  Bathroom / Shower only    Complete by:  As directed   Discharge activity:  Up to eat    Complete by:  As directed   Discharge activity: Bedrest    Complete by:  As directed   Discharge diet:  No restrictions    Complete by:  As directed   Do not have sex or do anything that might make you have an orgasm    Complete by:  As directed   Fetal Kick Count:  Lie on our left side for one hour after a meal, and count the number of times your baby kicks.  If it is less than 5 times, get up, move around and drink some juice.  Repeat the test 30 minutes later.  If it is still less than 5 kicks in an hour, notify your doctor.    Complete by:  As directed   Notify physician for a general feeling that "something is not right"    Complete by:  As directed   Notify physician for increase or change in vaginal discharge    Complete by:  As directed   Notify physician for intestinal cramps, with or without diarrhea, sometimes described as "gas pain"    Complete by:  As directed   Notify physician for leaking of fluid    Complete by:  As directed   Notify physician for low, dull backache, unrelieved by heat or Tylenol    Complete by:  As directed  Notify physician for menstrual like cramps    Complete by:  As directed   Notify physician for pelvic pressure    Complete by:  As directed   Notify physician for uterine contractions.  These may be painless and feel like the uterus is tightening or the baby is  "balling up"    Complete by:  As directed   Notify physician for vaginal bleeding    Complete by:  As directed   PRETERM LABOR:  Includes any of the follwing symptoms that occur between 20 - [redacted] weeks  gestation.  If these symptoms are not stopped, preterm labor can result in preterm delivery, placing your baby at risk    Complete by:  As directed      Follow-up Information    [facility] [place] .   Specialty:  Obstetrics and Gynecology Why:  Keep your next scheduled appointment Contact information: [address], [address] [place] [id number] [phone]          Signed: Reva BoresRATT,Elan Mcelvain S M.D. 11/03/2015, 7:29 AM

## 2015-11-07 ENCOUNTER — Ambulatory Visit (INDEPENDENT_AMBULATORY_CARE_PROVIDER_SITE_OTHER): Payer: 59 | Admitting: Obstetrics & Gynecology

## 2015-11-07 VITALS — BP 101/67 | HR 80 | Wt 243.0 lb

## 2015-11-07 DIAGNOSIS — O4593 Premature separation of placenta, unspecified, third trimester: Secondary | ICD-10-CM

## 2015-11-08 ENCOUNTER — Encounter: Payer: 59 | Admitting: Family

## 2015-11-09 NOTE — Progress Notes (Signed)
Subjective:  Teresa Rojas is a 35 y.o. G2P1001 at 2223w6d being seen today for ongoing prenatal care.  She is currently monitored for the following issues for this high-risk pregnancy and has Normal pregnancy in first trimester; Obesity in pregnancy, antepartum; Second trimester bleeding; Choroid plexus cyst of fetus on prenatal ultrasound; and Antepartum placental abruption on her problem list.  Patient reports no complaints.   .  .   . Denies leaking of fluid.   The following portions of the patient's history were reviewed and updated as appropriate: allergies, current medications, past family history, past medical history, past social history, past surgical history and problem list. Problem list updated.  Objective:   Vitals:   11/07/15 1500  BP: 101/67  Pulse: 80  Weight: 243 lb (110.2 kg)    Fetal Status:           General:  Alert, oriented and cooperative. Patient is in no acute distress.  Skin: Skin is warm and dry. No rash noted.   Cardiovascular: Normal heart rate noted  Respiratory: Normal respiratory effort, no problems with respiration noted  Abdomen: Soft, gravid, appropriate for gestational age.       Pelvic:  Cervical exam deferred        Extremities: Normal range of motion.     Mental Status: Normal mood and affect. Normal behavior. Normal judgment and thought content.   Urinalysis:      Assessment and Plan:  Pregnancy: G2P1001 at 223w6d  1. Placental abruption, third trimester - twice weekly testing with growth q3 weeks - delivery by 37 weeks (or sooner if clinical presentation changes) - US MFM OB FOLLOW UP; Future - US MFM FETAL BPP WO NON STRESS; Future  Preterm labor symptoms and general obstetric precautions including but not limited to vaginal bleeding, contractions, leaking of fluid and fetal movement were reviewed in detail with the patient. Please refer to After Visit Summary for other counseling recommendations.  Return in about 1 week (around  11/14/2015).   Lesly DukesKelly H Sara Selvidge, MD

## 2015-11-11 ENCOUNTER — Ambulatory Visit: Payer: 59 | Admitting: *Deleted

## 2015-11-11 DIAGNOSIS — O4593 Premature separation of placenta, unspecified, third trimester: Secondary | ICD-10-CM

## 2015-11-12 ENCOUNTER — Encounter: Payer: Self-pay | Admitting: *Deleted

## 2015-11-14 ENCOUNTER — Encounter: Payer: 59 | Admitting: Obstetrics & Gynecology

## 2015-11-15 ENCOUNTER — Encounter (HOSPITAL_COMMUNITY): Payer: Self-pay

## 2015-11-15 ENCOUNTER — Ambulatory Visit (HOSPITAL_COMMUNITY)
Admission: RE | Admit: 2015-11-15 | Discharge: 2015-11-15 | Disposition: A | Discharge status: Home / Self Care | Payer: 59 | Source: Ambulatory Visit | Attending: Obstetrics & Gynecology | Admitting: Obstetrics & Gynecology

## 2015-11-15 DIAGNOSIS — O4692 Antepartum hemorrhage, unspecified, second trimester: Secondary | ICD-10-CM

## 2015-11-15 DIAGNOSIS — O4593 Premature separation of placenta, unspecified, third trimester: Secondary | ICD-10-CM | POA: Diagnosis not present

## 2015-11-15 DIAGNOSIS — Z3A00 Weeks of gestation of pregnancy not specified: Secondary | ICD-10-CM | POA: Insufficient documentation

## 2015-11-15 DIAGNOSIS — O99212 Obesity complicating pregnancy, second trimester: Secondary | ICD-10-CM

## 2015-11-18 ENCOUNTER — Ambulatory Visit (INDEPENDENT_AMBULATORY_CARE_PROVIDER_SITE_OTHER): Payer: 59 | Admitting: *Deleted

## 2015-11-18 ENCOUNTER — Encounter: Payer: Self-pay | Admitting: *Deleted

## 2015-11-18 VITALS — BP 97/67 | HR 110 | Wt 248.0 lb

## 2015-11-18 DIAGNOSIS — O4593 Premature separation of placenta, unspecified, third trimester: Secondary | ICD-10-CM

## 2015-11-21 ENCOUNTER — Ambulatory Visit (INDEPENDENT_AMBULATORY_CARE_PROVIDER_SITE_OTHER): Payer: 59 | Admitting: Obstetrics & Gynecology

## 2015-11-21 VITALS — BP 109/70 | HR 100 | Wt 251.0 lb

## 2015-11-21 DIAGNOSIS — O4593 Premature separation of placenta, unspecified, third trimester: Secondary | ICD-10-CM

## 2015-11-21 DIAGNOSIS — O09893 Supervision of other high risk pregnancies, third trimester: Secondary | ICD-10-CM

## 2015-11-22 NOTE — Progress Notes (Signed)
Subjective:  Teresa Rojas is a 35 y.o. G2P1001 at 70106w5d being seen today for ongoing prenatal care.  She is currently monitored for the following issues for this high-risk pregnancy and has Normal pregnancy in first trimester; Obesity in pregnancy, antepartum; Second trimester bleeding; Choroid plexus cyst of fetus on prenatal ultrasound; and Antepartum placental abruption on her problem list.  Patient reports no complaints.  Contractions: Irregular. Vag. Bleeding: None.  Movement: Present. Denies leaking of fluid.   The following portions of the patient's history were reviewed and updated as appropriate: allergies, current medications, past family history, past medical history, past social history, past surgical history and problem list. Problem list updated.  Objective:   Vitals:   11/21/15 1304  BP: 109/70  Pulse: 100  Weight: 251 lb (113.9 kg)    Fetal Status:     Movement: Present  Presentation: Vertex  General:  Alert, oriented and cooperative. Patient is in no acute distress.  Skin: Skin is warm and dry. No rash noted.   Cardiovascular: Normal heart rate noted  Respiratory: Normal respiratory effort, no problems with respiration noted  Abdomen: Soft, gravid, appropriate for gestational age. Pain/Pressure: Absent     Pelvic:  Cervical exam deferred        Extremities: Normal range of motion.  Edema: Trace  Mental Status: Normal mood and affect. Normal behavior. Normal judgment and thought content.   Urinalysis:      Assessment and Plan:  Pregnancy: G2P1001 at 66106w5d  Chronic Abruption-No bleeding since discharge from the hospital.  Continue 2x week testing and delivery at 37 weeks.  Preterm labor symptoms and general obstetric precautions including but not limited to vaginal bleeding, contractions, leaking of fluid and fetal movement were reviewed in detail with the patient. Please refer to After Visit Summary for other counseling recommendations.  Return in about 1 week  (around 11/28/2015).   Lesly DukesKelly H Leggett, MD

## 2015-11-25 ENCOUNTER — Encounter: Payer: Self-pay | Admitting: *Deleted

## 2015-11-25 ENCOUNTER — Ambulatory Visit: Payer: 59 | Admitting: *Deleted

## 2015-11-25 VITALS — BP 102/74 | HR 125 | Wt 250.0 lb

## 2015-11-25 DIAGNOSIS — O4593 Premature separation of placenta, unspecified, third trimester: Secondary | ICD-10-CM

## 2015-11-27 ENCOUNTER — Telehealth (HOSPITAL_COMMUNITY): Payer: Self-pay | Admitting: *Deleted

## 2015-11-27 ENCOUNTER — Encounter (HOSPITAL_COMMUNITY): Payer: Self-pay | Admitting: *Deleted

## 2015-11-27 NOTE — Telephone Encounter (Signed)
Preadmission screen  

## 2015-11-28 ENCOUNTER — Ambulatory Visit (INDEPENDENT_AMBULATORY_CARE_PROVIDER_SITE_OTHER): Payer: 59 | Admitting: Obstetrics & Gynecology

## 2015-11-28 VITALS — BP 120/75 | HR 90 | Wt 249.0 lb

## 2015-11-28 DIAGNOSIS — O99213 Obesity complicating pregnancy, third trimester: Secondary | ICD-10-CM

## 2015-11-28 DIAGNOSIS — Z113 Encounter for screening for infections with a predominantly sexual mode of transmission: Secondary | ICD-10-CM | POA: Diagnosis not present

## 2015-11-28 DIAGNOSIS — O4593 Premature separation of placenta, unspecified, third trimester: Secondary | ICD-10-CM

## 2015-11-28 DIAGNOSIS — Z3483 Encounter for supervision of other normal pregnancy, third trimester: Secondary | ICD-10-CM

## 2015-11-28 DIAGNOSIS — E669 Obesity, unspecified: Secondary | ICD-10-CM

## 2015-11-28 LAB — OB RESULTS CONSOLE GBS: GBS: NEGATIVE

## 2015-11-28 NOTE — Patient Instructions (Signed)

## 2015-11-28 NOTE — Progress Notes (Signed)
   PRENATAL VISIT NOTE  Subjective:  Teresa Rojas is a 35 y.o. G2P1001 at 1682w4d being seen today for ongoing prenatal care.  She is currently monitored for the following issues for this high-risk pregnancy and has Supervision of normal pregnancy, antepartum; Obesity in pregnancy, antepartum; Second trimester bleeding; Choroid plexus cyst of fetus on prenatal ultrasound; and Antepartum placental abruption on her problem list.  Patient reports no complaints.  Contractions: Not present. Vag. Bleeding: None.  Movement: Present. Denies leaking of fluid.   The following portions of the patient's history were reviewed and updated as appropriate: allergies, current medications, past family history, past medical history, past social history, past surgical history and problem list. Problem list updated.  Objective:   Vitals:   11/28/15 1538  BP: 120/75  Pulse: 90  Weight: 249 lb (112.9 kg)    Fetal Status:     Movement: Present  Presentation: Vertex  General:  Alert, oriented and cooperative. Patient is in no acute distress.  Skin: Skin is warm and dry. No rash noted.   Cardiovascular: Normal heart rate noted  Respiratory: Normal respiratory effort, no problems with respiration noted  Abdomen: Soft, gravid, appropriate for gestational age. Pain/Pressure: Absent     Pelvic:  Cervical exam deferred        Extremities: Normal range of motion.  Edema: None  Mental Status: Normal mood and affect. Normal behavior. Normal judgment and thought content.   Urinalysis:      Assessment and Plan:  Pregnancy: G2P1001 at 3782w4d  1. Placental abruption, third trimester  - Culture, beta strep (group b only) - Urine cytology ancillary only NST reviewed and reactive.  For delivery at [redacted] week gestation  2. Supervision of normal pregnancy, antepartum, third trimester  3. Obesity in pregnancy, antepartum, third trimester   Preterm labor symptoms and general obstetric precautions including but not  limited to vaginal bleeding, contractions, leaking of fluid and fetal movement were reviewed in detail with the patient. Please refer to After Visit Summary for other counseling recommendations.  Return in about 1 week (around 12/05/2015).  Willodean Rosenthalarolyn Harraway-Smith, MD

## 2015-11-30 LAB — CULTURE, BETA STREP (GROUP B ONLY)

## 2015-12-03 ENCOUNTER — Ambulatory Visit: Payer: 59 | Admitting: *Deleted

## 2015-12-03 VITALS — BP 105/68 | HR 68 | Wt 251.0 lb

## 2015-12-03 DIAGNOSIS — O4593 Premature separation of placenta, unspecified, third trimester: Secondary | ICD-10-CM

## 2015-12-03 LAB — URINE CYTOLOGY ANCILLARY ONLY
Chlamydia: NEGATIVE
Neisseria Gonorrhea: NEGATIVE

## 2015-12-06 ENCOUNTER — Ambulatory Visit (INDEPENDENT_AMBULATORY_CARE_PROVIDER_SITE_OTHER): Payer: 59 | Admitting: Family

## 2015-12-06 VITALS — BP 110/67 | HR 98 | Wt 251.0 lb

## 2015-12-06 DIAGNOSIS — Z36 Encounter for antenatal screening of mother: Secondary | ICD-10-CM

## 2015-12-06 DIAGNOSIS — O4593 Premature separation of placenta, unspecified, third trimester: Secondary | ICD-10-CM

## 2015-12-06 DIAGNOSIS — Z3483 Encounter for supervision of other normal pregnancy, third trimester: Secondary | ICD-10-CM | POA: Diagnosis not present

## 2015-12-06 NOTE — Progress Notes (Signed)
Blood-Large

## 2015-12-06 NOTE — Progress Notes (Signed)
   PRENATAL VISIT NOTE  Subjective:  Teresa Rojas is a 35 y.o. G2P1001 at 6959w5d being seen today for ongoing prenatal care.  She is currently monitored for the following issues for this high-risk pregnancy and has Supervision of normal pregnancy, antepartum; Obesity in pregnancy, antepartum; Second trimester bleeding; Choroid plexus cyst of fetus on prenatal ultrasound; and Antepartum placental abruption on her problem list.  Patient reports no complaints.  Reports occasional dark brown discharge.  No active bleeding.  Contractions: Not present. Vag. Bleeding: None.  Movement: Present. Denies leaking of fluid.   The following portions of the patient's history were reviewed and updated as appropriate: allergies, current medications, past family history, past medical history, past social history, past surgical history and problem list. Problem list updated.  Objective:   Vitals:   12/06/15 1051  BP: 110/67  Pulse: 98  Weight: 251 lb (113.9 kg)    Fetal Status:   Fundal Height: 35 cm Movement: Present  Presentation: Vertex  General:  Alert, oriented and cooperative. Patient is in no acute distress.  Skin: Skin is warm and dry. No rash noted.   Cardiovascular: Normal heart rate noted  Respiratory: Normal respiratory effort, no problems with respiration noted  Abdomen: Soft, gravid, appropriate for gestational age. Pain/Pressure: Absent     Pelvic:  Cervical exam deferred        Extremities: Normal range of motion.  Edema: None  Mental Status: Normal mood and affect. Normal behavior. Normal judgment and thought content.   Urinalysis: Urine Protein: Negative Urine Glucose: Negative  Assessment and Plan:  Pregnancy: G2P1001 at 1759w5d  1. Supervision of high risk pregnancy, antepartum, third trimester - IOL scheduled for 12/08/15 - NST reactive  2. Antepartum placental abruption, third trimester - NST reactive  Preterm labor symptoms and general obstetric precautions including but  not limited to vaginal bleeding, contractions, leaking of fluid and fetal movement were reviewed in detail with the patient. Please refer to After Visit Summary for other counseling recommendations.   Eino FarberWalidah Kennith GainN Karim, CNM

## 2015-12-08 ENCOUNTER — Inpatient Hospital Stay (HOSPITAL_COMMUNITY)
Admission: RE | Admit: 2015-12-08 | Discharge: 2015-12-10 | DRG: 774 | Disposition: A | Discharge status: Home / Self Care | Payer: 59 | Source: Ambulatory Visit | Attending: Obstetrics and Gynecology | Admitting: Obstetrics and Gynecology

## 2015-12-08 ENCOUNTER — Encounter (HOSPITAL_COMMUNITY): Payer: Self-pay

## 2015-12-08 ENCOUNTER — Inpatient Hospital Stay (HOSPITAL_COMMUNITY): Payer: 59 | Admitting: Anesthesiology

## 2015-12-08 DIAGNOSIS — Z6839 Body mass index (BMI) 39.0-39.9, adult: Secondary | ICD-10-CM | POA: Diagnosis not present

## 2015-12-08 DIAGNOSIS — IMO0001 Reserved for inherently not codable concepts without codable children: Secondary | ICD-10-CM

## 2015-12-08 DIAGNOSIS — O4593 Premature separation of placenta, unspecified, third trimester: Secondary | ICD-10-CM | POA: Diagnosis present

## 2015-12-08 DIAGNOSIS — Z3A37 37 weeks gestation of pregnancy: Secondary | ICD-10-CM

## 2015-12-08 DIAGNOSIS — Z3483 Encounter for supervision of other normal pregnancy, third trimester: Secondary | ICD-10-CM

## 2015-12-08 DIAGNOSIS — O99214 Obesity complicating childbirth: Secondary | ICD-10-CM | POA: Diagnosis present

## 2015-12-08 DIAGNOSIS — O4692 Antepartum hemorrhage, unspecified, second trimester: Secondary | ICD-10-CM

## 2015-12-08 DIAGNOSIS — O99213 Obesity complicating pregnancy, third trimester: Secondary | ICD-10-CM

## 2015-12-08 LAB — CBC
HEMATOCRIT: 29 % — AB (ref 36.0–46.0)
Hemoglobin: 10.1 g/dL — ABNORMAL LOW (ref 12.0–15.0)
MCH: 29.7 pg (ref 26.0–34.0)
MCHC: 34.8 g/dL (ref 30.0–36.0)
MCV: 85.3 fL (ref 78.0–100.0)
PLATELETS: 243 10*3/uL (ref 150–400)
RBC: 3.4 MIL/uL — ABNORMAL LOW (ref 3.87–5.11)
RDW: 13.8 % (ref 11.5–15.5)
WBC: 9.4 10*3/uL (ref 4.0–10.5)

## 2015-12-08 LAB — TYPE AND SCREEN
ABO/RH(D): O POS
ANTIBODY SCREEN: NEGATIVE

## 2015-12-08 LAB — RPR: RPR Ser Ql: NONREACTIVE

## 2015-12-08 MED ORDER — PRENATAL MULTIVITAMIN CH
1.0000 | ORAL_TABLET | Freq: Every day | ORAL | Status: DC
  Administered 2015-12-09: 1 via ORAL
  Filled 2015-12-08: qty 1

## 2015-12-08 MED ORDER — TERBUTALINE SULFATE 1 MG/ML IJ SOLN
0.2500 mg | Freq: Once | INTRAMUSCULAR | Status: DC | PRN
  Filled 2015-12-08: qty 1

## 2015-12-08 MED ORDER — ZOLPIDEM TARTRATE 5 MG PO TABS: 5.0000 mg | ORAL_TABLET | Freq: Every evening | ORAL | Status: DC | PRN

## 2015-12-08 MED ORDER — LACTATED RINGERS IV SOLN
500.0000 mL | Freq: Once | INTRAVENOUS | Status: AC
  Administered 2015-12-08: 500 mL via INTRAVENOUS

## 2015-12-08 MED ORDER — EPHEDRINE 5 MG/ML INJ
10.0000 mg | INTRAVENOUS | Status: DC | PRN
  Filled 2015-12-08: qty 4

## 2015-12-08 MED ORDER — LIDOCAINE HCL (PF) 1 % IJ SOLN
INTRAMUSCULAR | Status: DC | PRN
  Administered 2015-12-08 (×2): 4 mL

## 2015-12-08 MED ORDER — OXYCODONE-ACETAMINOPHEN 5-325 MG PO TABS: 2.0000 | ORAL_TABLET | ORAL | Status: DC | PRN

## 2015-12-08 MED ORDER — ONDANSETRON HCL 4 MG/2ML IJ SOLN: 4.0000 mg | INTRAMUSCULAR | Status: DC | PRN

## 2015-12-08 MED ORDER — FENTANYL 2.5 MCG/ML BUPIVACAINE 1/10 % EPIDURAL INFUSION (WH - ANES): 14.0000 mL/h | INTRAMUSCULAR | Status: DC | PRN

## 2015-12-08 MED ORDER — ACETAMINOPHEN 325 MG PO TABS: 650.0000 mg | ORAL_TABLET | ORAL | Status: DC | PRN

## 2015-12-08 MED ORDER — DIPHENHYDRAMINE HCL 50 MG/ML IJ SOLN: 12.5000 mg | INTRAMUSCULAR | Status: DC | PRN

## 2015-12-08 MED ORDER — OXYTOCIN BOLUS FROM INFUSION
500.0000 mL | Freq: Once | INTRAVENOUS | Status: AC
  Administered 2015-12-08: 500 mL via INTRAVENOUS

## 2015-12-08 MED ORDER — SIMETHICONE 80 MG PO CHEW: 80.0000 mg | CHEWABLE_TABLET | ORAL | Status: DC | PRN

## 2015-12-08 MED ORDER — BENZOCAINE-MENTHOL 20-0.5 % EX AERO: 1.0000 "application " | INHALATION_SPRAY | CUTANEOUS | Status: DC | PRN

## 2015-12-08 MED ORDER — LIDOCAINE HCL (PF) 1 % IJ SOLN
30.0000 mL | INTRAMUSCULAR | Status: DC | PRN
  Filled 2015-12-08: qty 30

## 2015-12-08 MED ORDER — TETANUS-DIPHTH-ACELL PERTUSSIS 5-2.5-18.5 LF-MCG/0.5 IM SUSP: 0.5000 mL | Freq: Once | INTRAMUSCULAR | Status: DC

## 2015-12-08 MED ORDER — LACTATED RINGERS IV SOLN
INTRAVENOUS | Status: DC
  Administered 2015-12-08: 08:00:00 via INTRAVENOUS
  Administered 2015-12-08: 125 mL/h via INTRAVENOUS

## 2015-12-08 MED ORDER — PHENYLEPHRINE 40 MCG/ML (10ML) SYRINGE FOR IV PUSH (FOR BLOOD PRESSURE SUPPORT)
80.0000 ug | PREFILLED_SYRINGE | INTRAVENOUS | Status: DC | PRN
  Filled 2015-12-08: qty 10
  Filled 2015-12-08: qty 5

## 2015-12-08 MED ORDER — ONDANSETRON HCL 4 MG PO TABS
4.0000 mg | ORAL_TABLET | ORAL | Status: DC | PRN
Start: 2015-12-08 — End: 2015-12-10

## 2015-12-08 MED ORDER — SENNOSIDES-DOCUSATE SODIUM 8.6-50 MG PO TABS
2.0000 | ORAL_TABLET | ORAL | Status: DC
  Administered 2015-12-09 – 2015-12-10 (×2): 2 via ORAL
  Filled 2015-12-08 (×2): qty 2

## 2015-12-08 MED ORDER — LACTATED RINGERS IV SOLN: 500.0000 mL | INTRAVENOUS | Status: DC | PRN

## 2015-12-08 MED ORDER — FENTANYL 2.5 MCG/ML BUPIVACAINE 1/10 % EPIDURAL INFUSION (WH - ANES)
14.0000 mL/h | INTRAMUSCULAR | Status: DC | PRN
  Administered 2015-12-08: 14 mL/h via EPIDURAL
  Filled 2015-12-08: qty 125

## 2015-12-08 MED ORDER — PHENYLEPHRINE 40 MCG/ML (10ML) SYRINGE FOR IV PUSH (FOR BLOOD PRESSURE SUPPORT)
80.0000 ug | PREFILLED_SYRINGE | INTRAVENOUS | Status: DC | PRN
  Filled 2015-12-08: qty 5

## 2015-12-08 MED ORDER — OXYTOCIN 40 UNITS IN LACTATED RINGERS INFUSION - SIMPLE MED
1.0000 m[IU]/min | INTRAVENOUS | Status: DC
  Administered 2015-12-08: 2 m[IU]/min via INTRAVENOUS
  Filled 2015-12-08: qty 1000

## 2015-12-08 MED ORDER — MISOPROSTOL 25 MCG QUARTER TABLET
25.0000 ug | ORAL_TABLET | ORAL | Status: DC | PRN
  Administered 2015-12-08: 25 ug via VAGINAL
  Filled 2015-12-08: qty 1
  Filled 2015-12-08: qty 0.25

## 2015-12-08 MED ORDER — IBUPROFEN 600 MG PO TABS
600.0000 mg | ORAL_TABLET | Freq: Four times a day (QID) | ORAL | Status: DC
  Administered 2015-12-08 – 2015-12-10 (×7): 600 mg via ORAL
  Filled 2015-12-08 (×7): qty 1

## 2015-12-08 MED ORDER — FLEET ENEMA 7-19 GM/118ML RE ENEM: 1.0000 | ENEMA | RECTAL | Status: DC | PRN

## 2015-12-08 MED ORDER — ONDANSETRON HCL 4 MG/2ML IJ SOLN: 4.0000 mg | Freq: Four times a day (QID) | INTRAMUSCULAR | Status: DC | PRN

## 2015-12-08 MED ORDER — FENTANYL CITRATE (PF) 100 MCG/2ML IJ SOLN
100.0000 ug | INTRAMUSCULAR | Status: DC | PRN
  Administered 2015-12-08 (×3): 100 ug via INTRAVENOUS
  Filled 2015-12-08 (×3): qty 2

## 2015-12-08 MED ORDER — OXYTOCIN 40 UNITS IN LACTATED RINGERS INFUSION - SIMPLE MED: 2.5000 [IU]/h | INTRAVENOUS | Status: DC

## 2015-12-08 MED ORDER — SOD CITRATE-CITRIC ACID 500-334 MG/5ML PO SOLN
30.0000 mL | ORAL | Status: DC | PRN
  Administered 2015-12-08: 30 mL via ORAL
  Filled 2015-12-08: qty 15

## 2015-12-08 MED ORDER — COCONUT OIL OIL: 1.0000 "application " | TOPICAL_OIL | Status: DC | PRN

## 2015-12-08 MED ORDER — DIPHENHYDRAMINE HCL 25 MG PO CAPS
25.0000 mg | ORAL_CAPSULE | Freq: Four times a day (QID) | ORAL | Status: DC | PRN
Start: 2015-12-08 — End: 2015-12-10

## 2015-12-08 MED ORDER — OXYCODONE-ACETAMINOPHEN 5-325 MG PO TABS
1.0000 | ORAL_TABLET | ORAL | Status: DC | PRN
  Administered 2015-12-08: 1 via ORAL
  Filled 2015-12-08: qty 1

## 2015-12-08 NOTE — H&P (Signed)
LABOR AND DELIVERY ADMISSION HISTORY AND PHYSICAL NOTE  Teresa Rojas is a 35 y.o. female G2P1001 with IUP at [redacted]w[redacted]d by 10 wk bedside US presenting for IOL for chronic placental abruption, fetal choroid plexus.   She reports positive fetal movement. She denies leakage of fluid or vaginal bleeding.  Prenatal History/Complications: chronic placental abruption  Past Medical History: Past Medical History:  Diagnosis Date  . 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 Vision loss     Past Surgical History: Past Surgical History:  Procedure Laterality Date  . WISDOM TOOTH EXTRACTION      Obstetrical History: OB History    Gravida Para Term Preterm AB Living   2 1 1  0 0 1   SAB TAB Ectopic Multiple Live Births   0 0 0 0 1      Social History: Social History   Social History  . Marital status: Married    Spouse name: N/A  . Number of children: N/A  . Years of education: N/A   Social History Main Topics  . Smoking status: Never Smoker  . Smokeless tobacco: Never Used  . Alcohol use No  . Drug use: No  . Sexual activity: Yes    Partners: Male    Birth control/ protection: None   Other Topics Concern  . Not on file   Social History Narrative  . No narrative on file    Family History: Family History  Problem Relation Age of Onset  . Breast cancer Maternal Grandmother   . Breast cancer Paternal Grandmother     Allergies: No Known Allergies  Prescriptions Prior to Admission  Medication Sig Dispense Refill Last Dose  . citalopram (CELEXA) 20 MG tablet Take 20 mg by mouth daily.  1 12/07/2015  . docusate sodium (COLACE) 100 MG capsule TAKE 1 CAPSULE (100 MG TOTAL) BY MOUTH DAILY.  0 12/07/2015  . Prenatal Vit-Fe Fumarate-FA (MULTIVITAMIN-PRENATAL) 27-0.8 MG TABS tablet Take 1 tablet by mouth daily at 12 noon.   12/07/2015     Review of Systems   All systems reviewed and negative except as stated in HPI  Last menstrual period 03/28/2015, unknown if currently  breastfeeding. General appearance: alert, cooperative, appears stated age and no distress Lungs: clear to auscultation bilaterally Heart: regular rate and rhythm Abdomen: soft, non-tender; bowel sounds normal Extremities: No calf swelling or tenderness Presentation: cephalic Fetal monitoring: 120, mod var, +accels, no decels Uterine activity: quiet   SVE: 1.5/50/-3, posterior cervix, moderate consistency Foley bulb placed without complication. Cytotec to be placed.  Prenatal labs: ABO, Rh: --/--/O POS (08/04 1941) Antibody: NEG (08/04 1941) Rubella: !Error! RPR: NON REAC (07/12 0827)  HBsAg: NEGATIVE (03/06 1410)  HIV: NONREACTIVE (07/12 0827)  GBS:   NEG 1 hr Glucola: 128 Genetic screening:  Declined Anatomy US: Left choroid plexus cyst; h/o placenta previa (resolved)  Prenatal Transfer Tool  Maternal Diabetes: No Genetic Screening: Declined Maternal Ultrasounds/Referrals: Abnormal:  Findings:   Other: Fetal Ultrasounds or other Referrals:  Other: Chronic placental abruption Maternal Substance Abuse:  No Significant Maternal Medications:  None Significant Maternal Lab Results: None  No results found for this or any previous visit (from the past 24 hour(s)).  Patient Active Problem List   Diagnosis Date Noted  . Antepartum placental abruption 10/23/2015  . Choroid plexus cyst of fetus on prenatal ultrasound 08/16/2015  . Second trimester bleeding 07/02/2015  . Supervision of normal pregnancy, antepartum 06/03/2015  . Obesity in pregnancy, antepartum 06/03/2015  Assessment: Teresa Rojas is a 35 y.o. G2P1001 at 4150w0d here for IOL for chronic placental abruption.   #Labor: IOL with FB and cytotec #Pain: Epidural on request #FWB:  Cat I #ID:  GBS Neg #MOF: Breast #MOC:Mirena #Circ:  Yes, inpatient  Jen MowElizabeth Mumaw, DO 12/08/2015, 1:05 AM

## 2015-12-08 NOTE — Anesthesia Postprocedure Evaluation (Signed)
Anesthesia Post Note  Patient: Teresa Rojas  Procedure(s) Performed: * No procedures listed *  Patient location during evaluation: Mother Baby Anesthesia Type: Epidural Level of consciousness: awake and alert, oriented and patient cooperative Pain management: pain level controlled Vital Signs Assessment: post-procedure vital signs reviewed and stable Respiratory status: spontaneous breathing Cardiovascular status: stable Postop Assessment: no headache, epidural receding, patient able to bend at knees and no signs of nausea or vomiting Anesthetic complications: no Comments: Pain score 1.     Last Vitals:  Vitals:   12/08/15 1200 12/08/15 1304  BP: 112/69 111/73  Pulse: 70 66  Resp: 20 18  Temp: 36.9 C 36.9 C    Last Pain:  Vitals:   12/08/15 1304  TempSrc: Oral  PainSc:    Pain Goal: Patients Stated Pain Goal: 1 (12/08/15 0835)               Merrilyn PumaWRINKLE,Mackenze Grandison

## 2015-12-08 NOTE — Anesthesia Preprocedure Evaluation (Signed)
Anesthesia Evaluation  Patient identified by MRN, date of birth, ID band Patient awake    Reviewed: Allergy & Precautions, H&P , NPO status , Patient's Chart, lab work & pertinent test results  History of Anesthesia Complications Negative for: history of anesthetic complications  Airway Mallampati: II  TM Distance: >3 FB Neck ROM: full    Dental no notable dental hx. (+) Teeth Intact   Pulmonary neg pulmonary ROS,    Pulmonary exam normal breath sounds clear to auscultation       Cardiovascular negative cardio ROS   Rhythm:regular Rate:Normal     Neuro/Psych negative neurological ROS  negative psych ROS   GI/Hepatic negative GI ROS, Neg liver ROS,   Endo/Other  Morbid obesity  Renal/GU negative Renal ROS  negative genitourinary   Musculoskeletal   Abdominal Normal abdominal exam  (+)   Peds  Hematology negative hematology ROS (+)   Anesthesia Other Findings   Reproductive/Obstetrics (+) Pregnancy                             Anesthesia Physical  Anesthesia Plan  ASA: III  Anesthesia Plan: Epidural   Post-op Pain Management:    Induction:   Airway Management Planned:   Additional Equipment:   Intra-op Plan:   Post-operative Plan:   Informed Consent: I have reviewed the patients History and Physical, chart, labs and discussed the procedure including the risks, benefits and alternatives for the proposed anesthesia with the patient or authorized representative who has indicated his/her understanding and acceptance.     Plan Discussed with:   Anesthesia Plan Comments:         Anesthesia Quick Evaluation

## 2015-12-08 NOTE — Anesthesia Pain Management Evaluation Note (Signed)
  CRNA Pain Management Visit Note  Patient: Teresa Rojas, 35 y.o., female  "Hello [facility] am a [facility]. We have an anesthesia team available at all times to provide care throughout the hospital, including epidural management and anesthesia for C-section. I don't know your plan for the delivery whether it a natural birth, water birth, IV sedation, nitrous supplementation, doula or epidural, but we want to meet your pain goals."   1.Was your pain managed to your expectations on prior hospitalizations?   Yes   2.What is your expectation for pain management during this hospitalization?     Epidural  3.How can we help you reach that goal?Maintain comfort level of 0-1.  Record the patient's initial score and the patient's pain goal.   Pain: 0  Pain Goal: 1 [facility] wants you to be able to say your pain was always managed very well.  Yahaira Bruski 12/08/2015

## 2015-12-08 NOTE — Anesthesia Procedure Notes (Signed)
Epidural Patient location during procedure: OB  Staffing Anesthesiologist: Zaliah Wissner Performed: anesthesiologist   Preanesthetic Checklist Completed: patient identified, pre-op evaluation, timeout performed, IV checked, risks and benefits discussed and monitors and equipment checked  Epidural Patient position: sitting Prep: site prepped and draped and DuraPrep Patient monitoring: heart rate Approach: midline Location: L2-L3 Injection technique: LOR air and LOR saline  Needle:  Needle type: Tuohy  Needle gauge: 17 G Needle length: 9 cm Needle insertion depth: 9 cm Catheter type: closed end flexible Catheter size: 19 Gauge Catheter at skin depth: 15 cm Test dose: negative  Assessment Sensory level: T8 Events: blood not aspirated, injection not painful, no injection resistance, negative IV test and no paresthesia  Additional Notes Reason for block:procedure for pain     

## 2015-12-09 DIAGNOSIS — IMO0001 Reserved for inherently not codable concepts without codable children: Secondary | ICD-10-CM

## 2015-12-09 LAB — CBC
HCT: 27.6 % — ABNORMAL LOW (ref 36.0–46.0)
HEMOGLOBIN: 9 g/dL — AB (ref 12.0–15.0)
MCH: 28.4 pg (ref 26.0–34.0)
MCHC: 32.6 g/dL (ref 30.0–36.0)
MCV: 87.1 fL (ref 78.0–100.0)
PLATELETS: 221 10*3/uL (ref 150–400)
RBC: 3.17 MIL/uL — AB (ref 3.87–5.11)
RDW: 13.9 % (ref 11.5–15.5)
WBC: 12.6 10*3/uL — AB (ref 4.0–10.5)

## 2015-12-09 MED ORDER — CITALOPRAM HYDROBROMIDE 20 MG PO TABS
20.0000 mg | ORAL_TABLET | Freq: Every day | ORAL | Status: DC
  Administered 2015-12-09 – 2015-12-10 (×2): 20 mg via ORAL
  Filled 2015-12-09 (×2): qty 1

## 2015-12-09 MED ORDER — IBUPROFEN 600 MG PO TABS: 600.0000 mg | ORAL_TABLET | Freq: Four times a day (QID) | ORAL | 0 refills | Status: AC

## 2015-12-09 NOTE — Discharge Instructions (Signed)
Breastfeeding Deciding to breastfeed is one of the best choices you can make for you and your baby. A change in hormones during pregnancy causes your breast tissue to grow and increases the number and size of your milk ducts. These hormones also allow proteins, sugars, and fats from your blood supply to make breast milk in your milk-producing glands. Hormones prevent breast milk from being released before your baby is born as well as prompt milk flow after birth. Once breastfeeding has begun, thoughts of your baby, as well as his or her sucking or crying, can stimulate the release of milk from your milk-producing glands.  BENEFITS OF BREASTFEEDING For Your Baby  Your first milk (colostrum) helps your baby's digestive system function better.  There are antibodies in your milk that help your baby fight off infections.  Your baby has a lower incidence of asthma, allergies, and sudden infant death syndrome.  The nutrients in breast milk are better for your baby than infant formulas and are designed uniquely for your baby's needs.  Breast milk improves your baby's brain development.  Your baby is less likely to develop other conditions, such as childhood obesity, asthma, or type 2 diabetes mellitus. For You  Breastfeeding helps to create a very special bond between you and your baby.  Breastfeeding is convenient. Breast milk is always available at the correct temperature and costs nothing.  Breastfeeding helps to burn calories and helps you lose the weight gained during pregnancy.  Breastfeeding makes your uterus contract to its prepregnancy size faster and slows bleeding (lochia) after you give birth.   Breastfeeding helps to lower your risk of developing type 2 diabetes mellitus, osteoporosis, and breast or ovarian cancer later in life. SIGNS THAT YOUR BABY IS HUNGRY Early Signs of Hunger  Increased alertness or activity.  Stretching.  Movement of the head from side to  side.  Movement of the head and opening of the mouth when the corner of the mouth or cheek is stroked (rooting).  Increased sucking sounds, smacking lips, cooing, sighing, or squeaking.  Hand-to-mouth movements.  Increased sucking of fingers or hands. Late Signs of Hunger  Fussing.  Intermittent crying. Extreme Signs of Hunger Signs of extreme hunger will require calming and consoling before your baby will be able to breastfeed successfully. Do not wait for the following signs of extreme hunger to occur before you initiate breastfeeding:  Restlessness.  A loud, strong cry.  Screaming. BREASTFEEDING BASICS Breastfeeding Initiation  Find a comfortable place to sit or lie down, with your neck and back well supported.  Place a pillow or rolled up blanket under your baby to bring him or her to the level of your breast (if you are seated). Nursing pillows are specially designed to help support your arms and your baby while you breastfeed.  Make sure that your baby's abdomen is facing your abdomen.  Gently massage your breast. With your fingertips, massage from your chest wall toward your nipple in a circular motion. This encourages milk flow. You may need to continue this action during the feeding if your milk flows slowly.  Support your breast with 4 fingers underneath and your thumb above your nipple. Make sure your fingers are well away from your nipple and your baby's mouth.  Stroke your baby's lips gently with your finger or nipple.  When your baby's mouth is open wide enough, quickly bring your baby to your breast, placing your entire nipple and as much of the colored area around your nipple (  areola) as possible into your baby's mouth.  More areola should be visible above your baby's upper lip than below the lower lip.  Your baby's tongue should be between his or her lower gum and your breast.  Ensure that your baby's mouth is correctly positioned around your nipple  (latched). Your baby's lips should create a seal on your breast and be turned out (everted).  It is common for your baby to suck about 2-3 minutes in order to start the flow of breast milk. Latching Teaching your baby how to latch on to your breast properly is very important. An improper latch can cause nipple pain and decreased milk supply for you and poor weight gain in your baby. Also, if your baby is not latched onto your nipple properly, he or she may swallow some air during feeding. This can make your baby fussy. Burping your baby when you switch breasts during the feeding can help to get rid of the air. However, teaching your baby to latch on properly is still the best way to prevent fussiness from swallowing air while breastfeeding. Signs that your baby has successfully latched on to your nipple:  Silent tugging or silent sucking, without causing you pain.  Swallowing heard between every 3-4 sucks.  Muscle movement above and in front of his or her ears while sucking. Signs that your baby has not successfully latched on to nipple:  Sucking sounds or smacking sounds from your baby while breastfeeding.  Nipple pain. If you think your baby has not latched on correctly, slip your finger into the corner of your baby's mouth to break the suction and place it between your baby's gums. Attempt breastfeeding initiation again. Signs of Successful Breastfeeding Signs from your baby:  A gradual decrease in the number of sucks or complete cessation of sucking.  Falling asleep.  Relaxation of his or her body.  Retention of a small amount of milk in his or her mouth.  Letting go of your breast by himself or herself. Signs from you:  Breasts that have increased in firmness, weight, and size 1-3 hours after feeding.  Breasts that are softer immediately after breastfeeding.  Increased milk volume, as well as a change in milk consistency and color by the fifth day of breastfeeding.  Nipples  that are not sore, cracked, or bleeding. Signs That Your Baby is Getting Enough Milk  Wetting at least 3 diapers in a 24-hour period. The urine should be clear and pale yellow by age 5 days.  At least 3 stools in a 24-hour period by age 5 days. The stool should be soft and yellow.  At least 3 stools in a 24-hour period by age 7 days. The stool should be seedy and yellow.  No loss of weight greater than 10% of birth weight during the first 3 days of age.  Average weight gain of 4-7 ounces (113-198 g) per week after age 4 days.  Consistent daily weight gain by age 5 days, without weight loss after the age of 2 weeks. After a feeding, your baby may spit up a small amount. This is common. BREASTFEEDING FREQUENCY AND DURATION Frequent feeding will help you make more milk and can prevent sore nipples and breast engorgement. Breastfeed when you feel the need to reduce the fullness of your breasts or when your baby shows signs of hunger. This is called "breastfeeding on demand." Avoid introducing a pacifier to your baby while you are working to establish breastfeeding (the first 4-6 weeks   after your baby is born). After this time you may choose to use a pacifier. Research has shown that pacifier use during the first year of a baby's life decreases the risk of sudden infant death syndrome (SIDS). Allow your baby to feed on each breast as long as he or she wants. Breastfeed until your baby is finished feeding. When your baby unlatches or falls asleep while feeding from the first breast, offer the second breast. Because newborns are often sleepy in the first few weeks of life, you may need to awaken your baby to get him or her to feed. Breastfeeding times will vary from baby to baby. However, the following rules can serve as a guide to help you ensure that your baby is properly fed:  Newborns (babies 4 weeks of age or younger) may breastfeed every 1-3 hours.  Newborns should not go longer than 3 hours  during the day or 5 hours during the night without breastfeeding.  You should breastfeed your baby a minimum of 8 times in a 24-hour period until you begin to introduce solid foods to your baby at around 6 months of age. BREAST MILK PUMPING Pumping and storing breast milk allows you to ensure that your baby is exclusively fed your breast milk, even at times when you are unable to breastfeed. This is especially important if you are going back to work while you are still breastfeeding or when you are not able to be present during feedings. Your lactation consultant can give you guidelines on how long it is safe to store breast milk. A breast pump is a machine that allows you to pump milk from your breast into a sterile bottle. The pumped breast milk can then be stored in a refrigerator or freezer. Some breast pumps are operated by hand, while others use electricity. Ask your lactation consultant which type will work best for you. Breast pumps can be purchased, but some hospitals and breastfeeding support groups lease breast pumps on a monthly basis. A lactation consultant can teach you how to hand express breast milk, if you prefer not to use a pump. CARING FOR YOUR BREASTS WHILE YOU BREASTFEED Nipples can become dry, cracked, and sore while breastfeeding. The following recommendations can help keep your breasts moisturized and healthy:  Avoid using soap on your nipples.  Wear a supportive bra. Although not required, special nursing bras and tank tops are designed to allow access to your breasts for breastfeeding without taking off your entire bra or top. Avoid wearing underwire-style bras or extremely tight bras.  Air dry your nipples for 3-4minutes after each feeding.  Use only cotton bra pads to absorb leaked breast milk. Leaking of breast milk between feedings is normal.  Use lanolin on your nipples after breastfeeding. Lanolin helps to maintain your skin's normal moisture barrier. If you use  pure lanolin, you do not need to wash it off before feeding your baby again. Pure lanolin is not toxic to your baby. You may also hand express a few drops of breast milk and gently massage that milk into your nipples and allow the milk to air dry. In the first few weeks after giving birth, some women experience extremely full breasts (engorgement). Engorgement can make your breasts feel heavy, warm, and tender to the touch. Engorgement peaks within 3-5 days after you give birth. The following recommendations can help ease engorgement:  Completely empty your breasts while breastfeeding or pumping. You may want to start by applying warm, moist heat (in   the shower or with warm water-soaked hand towels) just before feeding or pumping. This increases circulation and helps the milk flow. If your baby does not completely empty your breasts while breastfeeding, pump any extra milk after he or she is finished.  Wear a snug bra (nursing or regular) or tank top for 1-2 days to signal your body to slightly decrease milk production.  Apply ice packs to your breasts, unless this is too uncomfortable for you.  Make sure that your baby is latched on and positioned properly while breastfeeding. If engorgement persists after 48 hours of following these recommendations, contact your health care provider or a lactation consultant. OVERALL HEALTH CARE RECOMMENDATIONS WHILE BREASTFEEDING  Eat healthy foods. Alternate between meals and snacks, eating 3 of each per day. Because what you eat affects your breast milk, some of the foods may make your baby more irritable than usual. Avoid eating these foods if you are sure that they are negatively affecting your baby.  Drink milk, fruit juice, and water to satisfy your thirst (about 10 glasses a day).  Rest often, relax, and continue to take your prenatal vitamins to prevent fatigue, stress, and anemia.  Continue breast self-awareness checks.  Avoid chewing and smoking  tobacco. Chemicals from cigarettes that pass into breast milk and exposure to secondhand smoke may harm your baby.  Avoid alcohol and drug use, including marijuana. Some medicines that may be harmful to your baby can pass through breast milk. It is important to ask your health care provider before taking any medicine, including all over-the-counter and prescription medicine as well as vitamin and herbal supplements. It is possible to become pregnant while breastfeeding. If birth control is desired, ask your health care provider about options that will be safe for your baby. SEEK MEDICAL CARE IF:  You feel like you want to stop breastfeeding or have become frustrated with breastfeeding.  You have painful breasts or nipples.  Your nipples are cracked or bleeding.  Your breasts are red, tender, or warm.  You have a swollen area on either breast.  You have a fever or chills.  You have nausea or vomiting.  You have drainage other than breast milk from your nipples.  Your breasts do not become full before feedings by the fifth day after you give birth.  You feel sad and depressed.  Your baby is too sleepy to eat well.  Your baby is having trouble sleeping.   Your baby is wetting less than 3 diapers in a 24-hour period.  Your baby has less than 3 stools in a 24-hour period.  Your baby's skin or the white part of his or her eyes becomes yellow.   Your baby is not gaining weight by 5 days of age. SEEK IMMEDIATE MEDICAL CARE IF:  Your baby is overly tired (lethargic) and does not want to wake up and feed.  Your baby develops an unexplained fever.   This information is not intended to replace advice given to you by your health care provider. Make sure you discuss any questions you have with your health care provider.   Document Released: 03/16/2005 Document Revised: 12/05/2014 Document Reviewed: 09/07/2012 Elsevier Interactive Patient Education 2016 [employer]  

## 2015-12-09 NOTE — Discharge Summary (Signed)
OB Discharge Summary     Patient Name: Teresa Rojas DOB: 1980/07/08 MRN: [id number]  Date of admission: 12/08/2015 Delivering MD: Wyvonnia Dusky D   Date of discharge: 12/09/2015  Admitting diagnosis: INDUCTION Intrauterine pregnancy: [redacted]w[redacted]d     Secondary diagnosis:  Active Problems:   Placental abruption in third trimester   Status post vaginal delivery  Additional problems: none     Discharge diagnosis: Term Pregnancy Delivered                                                                                                Post partum procedures:none  Augmentation: Pitocin, Cytotec and Foley Balloon  Complications: none  Hospital course:  Induction of Labor With Vaginal Delivery   35 y.o. yo G2P1001 at [redacted]w[redacted]d was admitted to the hospital 12/08/2015 for induction of labor.  Indication for induction: chronic palcental abruption.  Patient had an uncomplicated labor course as follows: Membrane Rupture Time/Date: 6:04 AM ,12/08/2015   Intrapartum Procedures: Episiotomy: None [1]                                         Lacerations:  None [1]  Patient had delivery of a Viable infant.  Information for the patient's newborn:  Zya, Finkle [[id number]]  Delivery Method: Vaginal, Spontaneous Delivery (Filed from Delivery Summary)   12/08/2015  Details of delivery can be found in separate delivery note.  Patient had a routine postpartum course. Patient is discharged home 12/09/15.   Physical exam Vitals:   12/08/15 1200 12/08/15 1304 12/08/15 1749 12/09/15 0652  BP: 112/69 111/73 115/66 101/64  Pulse: 70 66 68 65  Resp: 20 18 18 17   Temp: 98.4 F (36.9 C) 98.4 F (36.9 C) 98.4 F (36.9 C) 98.1 F (36.7 C)  TempSrc: Oral Oral Oral Oral  Weight:      Height:       General: alert, cooperative and no distress Lochia: appropriate Uterine Fundus: firm Incision: N/A DVT Evaluation: No evidence of DVT seen on physical exam. Negative Homan's sign. Labs: Lab Results   Component Value Date   WBC 12.6 (H) 12/09/2015   HGB 9.0 (L) 12/09/2015   HCT 27.6 (L) 12/09/2015   MCV 87.1 12/09/2015   PLT 221 12/09/2015   CMP Latest Ref Rng & Units 02/07/2014  Glucose 70 - 99 mg/dL 77  BUN 6 - 23 mg/dL 15  Creatinine 7.82 - 9.56 mg/dL 2.13  Sodium 086 - 578 mEq/L 140  Potassium 3.5 - 5.3 mEq/L 4.1  Chloride 96 - 112 mEq/L 103  CO2 19 - 32 mEq/L 24  Calcium 8.4 - 10.5 mg/dL 9.7  Total Protein 6.0 - 8.3 g/dL 7.4  Total Bilirubin 0.2 - 1.2 mg/dL 0.5  Alkaline Phos 39 - 117 U/L 90  AST 0 - 37 U/L 13  ALT 0 - 35 U/L 12    Discharge instruction: per After Visit Summary and "Baby and Me Booklet".  After visit meds:    Medication List  TAKE these medications   acetaminophen 500 MG tablet Commonly known as:  TYLENOL Take 1,000 mg by mouth every 6 (six) hours as needed for mild pain, moderate pain or headache.   calcium carbonate 500 MG chewable tablet Commonly known as:  TUMS - dosed in mg elemental calcium Chew 1-2 tablets by mouth as needed for indigestion or heartburn.   citalopram 20 MG tablet Commonly known as:  CELEXA Take 20 mg by mouth daily.   ibuprofen 600 MG tablet Commonly known as:  ADVIL,MOTRIN Take 1 tablet (600 mg total) by mouth every 6 (six) hours.   multivitamin-prenatal 27-0.8 MG Tabs tablet Take 1 tablet by mouth daily.       Diet: routine diet  Activity: Advance as tolerated. Pelvic rest for 6 weeks.   Outpatient follow up:6 weeks Follow up Appt:No future appointments. Follow up Visit:No Follow-up on file.  Postpartum contraception: IUD Mirena  Newborn Data: Live born female  Birth Weight: 5 lb 13.5 oz (2650 g) APGAR: 9, 9  Baby Feeding: Breast Disposition:home with mother   12/09/2015 Frederik PearJulie P Degele, MD

## 2015-12-09 NOTE — Lactation Note (Signed)
This note was copied from a baby's chart. Lactation Consultation Note Initial  Visit at 31 hours of age.  Baby is 37w and weight is 5#10oz.  Mom reports several good feedings, with slightly sore nipples.  Mom has easily expressed colostrum leaking and applied to nipples.  Mom has lanolin and paci at bedside, LC discussed reasons not to use both.  LC discussed early term behavior and encouraged mom [facility].  Baby is in nursery getting circumcision, Lc discussed normal post circ behavior as sleepy and not eager to latch.  LC provided mom with small colostrum containers to work on hand expression and encouraged mom to spoon feed baby with breast feedings to give extra calories to baby as "appetizer or after feedings."  Baby has had 8 feedings with 4 voids and 3 stools.  LC discussed with mom may need to pump if baby is getting sleepy during feedings or if she isn't seeing as much colostrum.  Mom will plan to work on hand expression first.  Mom aware to call Rn for assist with spoon feedings when she is ready.  [facility] given and discussed.  Encouraged to feed with early cues on demand.  Early newborn behavior discussed.  Mom to call for assist as needed.      Patient Name: Teresa Rojas YNWGN'FToday's Date: 12/09/2015 Reason for consult: Initial assessment;Infant < 6lbs   Maternal Data Has patient been taught Hand Expression?: Yes Does the patient have breastfeeding experience prior to this delivery?: Yes  Feeding Feeding Type: Breast Fed Length of feed: 8 min  LATCH Score/Interventions                Intervention(s): Breastfeeding basics reviewed;Position options     Lactation Tools Discussed/Used     Consult Status Consult Status: Follow-up Date: 12/10/15 Follow-up type: In-patient    Jannifer RodneyShoptaw, Jana Lynn 12/09/2015, 5:28 PM

## 2015-12-10 ENCOUNTER — Ambulatory Visit: Payer: Self-pay

## 2015-12-10 ENCOUNTER — Encounter (HOSPITAL_COMMUNITY): Payer: Self-pay | Admitting: *Deleted

## 2015-12-10 NOTE — Plan of Care (Signed)
Problem: Nutritional: Goal: Mother's verbalization of comfort with breastfeeding process will improve Outcome: Completed/Met Date Met: 12/10/15 Pt expresses comfort with breastfeeding.  Declines help from lactation at this time.

## 2015-12-10 NOTE — Lactation Note (Signed)
This note was copied from a baby's chart. Lactation Consultation Note  Patient Name: Teresa Rojas RUEAV'WToday's Date: 12/10/2015  Per RN, Mom declines LC visit today before d/c home.   Maternal Data    Feeding    LATCH Score/Interventions                      Lactation Tools Discussed/Used     Consult Status      Alfred LevinsGranger, Haeli Gerlich Ann 12/10/2015, 4:32 PM

## 2015-12-10 NOTE — Discharge Summary (Signed)
OB Discharge Summary     Patient Name: Teresa Rojas DOB: 06/23/1980 MRN: [id number]  Date of admission: 12/08/2015 Delivering MD: Wyvonnia DuskyLAWSON, MARIE D   Date of discharge: 12/10/2015  Admitting diagnosis: INDUCTION Intrauterine pregnancy: 4060w1d     Secondary diagnosis:  Active Problems:   Placental abruption in third trimester   Status post vaginal delivery  Additional problems: none     Discharge diagnosis: Term Pregnancy Delivered                                                                                                Post partum procedures:none  Augmentation: Pitocin, Cytotec and Foley Balloon  Complications: none  Hospital course:  Induction of Labor With Vaginal Delivery   35 y.o. yo G2P1001 at 6360w1d was admitted to the hospital 12/08/2015 for induction of labor.  Indication for induction: chronic palcental abruption.  Patient had an uncomplicated labor course as follows: Membrane Rupture Time/Date: 6:04 AM ,12/08/2015   Intrapartum Procedures: Episiotomy: None [1]                                         Lacerations:  None [1]  Patient had delivery of a Viable infant.  Information for the patient's newborn:  Teresa Rojas, Boy Morayma [[id number]][[id number]]  Delivery Method: Vaginal, Spontaneous Delivery (Filed from Delivery Summary)   12/08/2015  Details of delivery can be found in separate delivery note.  Patient had a routine postpartum course. Patient is discharged home 12/10/15.   Physical exam  Vitals:   12/08/15 1749 12/09/15 0652 12/09/15 1745 12/10/15 0500  BP: 115/66 101/64 116/64 (!) 111/53  Pulse: 68 65 70 70  Resp: 18 17 18 18   Temp: 98.4 F (36.9 C) 98.1 F (36.7 C) 98.5 F (36.9 C) 97.5 F (36.4 C)  TempSrc: Oral Oral Oral Oral  Weight:      Height:       General: alert, cooperative and no distress Lochia: appropriate Uterine Fundus: firm Incision: N/A DVT Evaluation: No evidence of DVT seen on physical exam. Negative Homan's sign. Labs: Lab Results   Component Value Date   WBC 12.6 (H) 12/09/2015   HGB 9.0 (L) 12/09/2015   HCT 27.6 (L) 12/09/2015   MCV 87.1 12/09/2015   PLT 221 12/09/2015   CMP Latest Ref Rng & Units 02/07/2014  Glucose 70 - 99 mg/dL 77  BUN 6 - 23 mg/dL 15  Creatinine 7.820.50 - 9.561.10 mg/dL 2.130.73  Sodium 086135 - 578145 mEq/L 140  Potassium 3.5 - 5.3 mEq/L 4.1  Chloride 96 - 112 mEq/L 103  CO2 19 - 32 mEq/L 24  Calcium 8.4 - 10.5 mg/dL 9.7  Total Protein 6.0 - 8.3 g/dL 7.4  Total Bilirubin 0.2 - 1.2 mg/dL 0.5  Alkaline Phos 39 - 117 U/L 90  AST 0 - 37 U/L 13  ALT 0 - 35 U/L 12    Discharge instruction: per After Visit Summary and "Baby and Me Booklet".  After visit meds:    Medication  List    TAKE these medications   acetaminophen 500 MG tablet Commonly known as:  TYLENOL Take 1,000 mg by mouth every 6 (six) hours as needed for mild pain, moderate pain or headache.   calcium carbonate 500 MG chewable tablet Commonly known as:  TUMS - dosed in mg elemental calcium Chew 1-2 tablets by mouth as needed for indigestion or heartburn.   citalopram 20 MG tablet Commonly known as:  CELEXA Take 20 mg by mouth daily.   ibuprofen 600 MG tablet Commonly known as:  ADVIL,MOTRIN Take 1 tablet (600 mg total) by mouth every 6 (six) hours.   multivitamin-prenatal 27-0.8 MG Tabs tablet Take 1 tablet by mouth daily.       Diet: routine diet  Activity: Advance as tolerated. Pelvic rest for 6 weeks.   Outpatient follow up:6 weeks Follow up Appt:No future appointments. Follow up Visit:No Follow-up on file.  Postpartum contraception: IUD Mirena  Newborn Data: Live born female  Birth Weight: 5 lb 13.5 oz (2650 g) APGAR: 9, 9  Baby Feeding: Breast Disposition:home with mother   12/10/2015 Frederik Pear, MD  Midwife attestation I have seen and examined this patient and agree with above documentation in the resident's note.   Teresa Rojas is a 35 y.o. G2P1001 s/p SVD.   Pain is well controlled.  Plan for  birth control is IUD.  Method of Feeding: breast  PE:  BP (!) 111/53   Pulse 70   Temp 97.5 F (36.4 C) (Oral)   Resp 18   Ht 5\' 7"  (1.702 m)   Wt 113.9 kg (251 lb)   LMP 03/28/2015   Breastfeeding? Unknown   BMI 39.31 kg/m  Gen: well appearing Heart: reg rate Lungs: normal WOB Fundus firm Ext: soft, no pain, no edema   Recent Labs  12/09/15 0537  HGB 9.0*  HCT 27.6*     Plan: discharge today - postpartum care discussed - f/u clinic in 6 weeks for postpartum visit   Donette Larry, CNM 10:50 AM

## 2016-01-20 ENCOUNTER — Encounter: Payer: Self-pay | Admitting: Obstetrics & Gynecology

## 2016-01-20 ENCOUNTER — Ambulatory Visit (INDEPENDENT_AMBULATORY_CARE_PROVIDER_SITE_OTHER): Payer: 59 | Admitting: Obstetrics & Gynecology

## 2016-01-20 ENCOUNTER — Encounter: Payer: Self-pay | Admitting: *Deleted

## 2016-01-20 VITALS — BP 116/81 | HR 75 | Ht 67.0 in | Wt 234.0 lb

## 2016-01-20 DIAGNOSIS — Z30430 Encounter for insertion of intrauterine contraceptive device: Secondary | ICD-10-CM | POA: Diagnosis not present

## 2016-01-20 DIAGNOSIS — Z0000 Encounter for general adult medical examination without abnormal findings: Secondary | ICD-10-CM

## 2016-01-20 DIAGNOSIS — Z3202 Encounter for pregnancy test, result negative: Secondary | ICD-10-CM | POA: Diagnosis not present

## 2016-01-20 DIAGNOSIS — Z23 Encounter for immunization: Secondary | ICD-10-CM | POA: Diagnosis not present

## 2016-01-20 LAB — POCT URINE PREGNANCY: PREG TEST UR: NEGATIVE

## 2016-01-20 MED ORDER — LEVONORGESTREL 20 MCG/24HR IU IUD
INTRAUTERINE_SYSTEM | Freq: Once | INTRAUTERINE | Status: AC
  Administered 2016-01-20: 14:00:00 via INTRAUTERINE

## 2016-01-20 NOTE — Progress Notes (Signed)
Subjective:     Teresa Rojas is a 35 y.o. MW P2  female who presents for a postpartum visit. She is 6 weeks postpartum following a spontaneous vaginal delivery. I have fully reviewed the prenatal and intrapartum course. The delivery was at 37 gestational weeks. Outcome: spontaneous vaginal delivery. Anesthesia: epidural. Postpartum course has been normal. Baby's course has been normal . Baby is feeding by breast. Bleeding no bleeding. Bowel function is normal. Bladder function is normal. Patient is not sexually active. Contraception method is IUD. Postpartum depression screening: negative.  The following portions of the patient's history were reviewed and updated as appropriate: allergies, current medications, past family history, past medical history, past social history, past surgical history and problem list.  Review of Systems Pertinent items are noted in HPI.   Objective:    BP 116/81   Pulse 75   Ht 5\' 7"  (1.702 m)   Wt 234 lb (106.1 kg)   Breastfeeding? Yes   BMI 36.65 kg/m   General:  alert   Breasts:  inspection negative, no nipple discharge or bleeding, no masses or nodularity palpable  Lungs: clear to auscultation bilaterally  Heart:  regular rate and rhythm, S1, S2 normal, no murmur, click, rub or gallop  Abdomen: soft, non-tender; bowel sounds normal; no masses,  no organomegaly   Vulva:  normal  Vagina: normal vagina  Cervix:  anteverted  Corpus: normal  Adnexa:  no mass, fullness, tenderness  Rectal Exam: Not performed.        UPT negative, consent signed, Time out procedure done. Cervix prepped with betadine and grasped with a single tooth tenaculum. Mirena was easily placed and the strings were cut to 3-4 cm. Uterus sounded to 9 cm. She tolerated the procedure well.   Assessment:     Normal  postpartum exam. Pap smear not done at today's visit.   Plan:    1. Contraception: IUD 2. Pap and fasting labs next year 3. Flu vaccine today

## 2016-03-06 ENCOUNTER — Ambulatory Visit (INDEPENDENT_AMBULATORY_CARE_PROVIDER_SITE_OTHER): Payer: 59 | Admitting: Physician Assistant

## 2016-03-06 ENCOUNTER — Encounter: Payer: Self-pay | Admitting: Physician Assistant

## 2016-03-06 VITALS — BP 130/83 | HR 83 | Ht 67.0 in | Wt 236.0 lb

## 2016-03-06 DIAGNOSIS — Z13220 Encounter for screening for lipoid disorders: Secondary | ICD-10-CM | POA: Diagnosis not present

## 2016-03-06 DIAGNOSIS — Z131 Encounter for screening for diabetes mellitus: Secondary | ICD-10-CM | POA: Diagnosis not present

## 2016-03-06 DIAGNOSIS — R5383 Other fatigue: Secondary | ICD-10-CM | POA: Insufficient documentation

## 2016-03-06 DIAGNOSIS — F411 Generalized anxiety disorder: Secondary | ICD-10-CM | POA: Insufficient documentation

## 2016-03-06 DIAGNOSIS — Z0000 Encounter for general adult medical examination without abnormal findings: Secondary | ICD-10-CM

## 2016-03-06 DIAGNOSIS — H5440 Blindness, one eye, unspecified eye: Secondary | ICD-10-CM | POA: Insufficient documentation

## 2016-03-06 MED ORDER — CITALOPRAM HYDROBROMIDE 20 MG PO TABS: 20.0000 mg | ORAL_TABLET | Freq: Every day | ORAL | 1 refills | Status: DC

## 2016-03-06 NOTE — Progress Notes (Signed)
Subjective:    Patient ID: Teresa LoutNatalie R Rojas, female    DOB: 04/11/1980, 35 y.o.   MRN: [id number]  HPI Pt is a 35 yo female who presents to the clinic to establish care after her PCP left the clinic.   .. Active Ambulatory Problems    Diagnosis Date Noted  . Supervision of normal pregnancy, antepartum 06/03/2015  . Obesity in pregnancy, antepartum 06/03/2015  . Second trimester bleeding 07/02/2015  . Choroid plexus cyst of fetus on prenatal ultrasound 08/16/2015  . Antepartum placental abruption 10/23/2015  . Placental abruption in third trimester 12/08/2015  . Status post vaginal delivery 12/09/2015  . 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 No energy 03/06/2016   Resolved Ambulatory Problems    Diagnosis Date Noted  . Supervision of normal first pregnancy in first trimester 02/03/2013  . Rubella non-immune status, antepartum 02/07/2013  . Low-lying placenta 06/01/2013  . Supervision of normal first pregnancy 07/19/2013  . PROM (premature rupture of membranes) 09/10/2013  . IUD (intrauterine device) in place 10/17/2013  . Placenta previa antepartum 08/16/2015  . Placenta previa antepartum in second trimester 09/20/2015  . Placenta previa with hemorrhage in second trimester 09/20/2015   Past Medical History:  Diagnosis Date  . 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 Vision loss    .Marland Kitchen. Family History  Problem Relation Age of Onset  . Breast cancer Maternal Grandmother   . Breast cancer Paternal Grandmother    .Marland Kitchen. Social History   Social History  . Marital status: Married    Spouse name: N/A  . Number of children: N/A  . Years of education: N/A   Occupational History  . Not on file.   Social History Main Topics  . Smoking status: Never Smoker  . Smokeless tobacco: Never Used  . Alcohol use No  . Drug use: No  . Sexual activity: Yes    Partners: Male    Birth control/ protection: None   Other Topics Concern  . Not on file   Social History Narrative  . No  narrative on file   She needs form and labs for work.   She needs refill for her celexa for anxiety. Doing great.   She did just have a baby 12 weeks ago. She is very tired but not sleeping well.     Review of Systems  All other systems reviewed and are negative.      Objective:   Physical Exam  Constitutional: She is oriented to person, place, and time. She appears well-developed and well-nourished.  HENT:  Head: Normocephalic and atraumatic.  Right Ear: External ear normal.  Left Ear: External ear normal.  Nose: Nose normal.  Mouth/Throat: Oropharynx is clear and moist. No oropharyngeal exudate.  Eyes: Conjunctivae and EOM are normal. Pupils are equal, round, and reactive to light. Right eye exhibits no discharge. Left eye exhibits no discharge.  Right eye not responsive to light. Total blindness.   Neck: Normal range of motion. Neck supple. No thyromegaly present.  Cardiovascular: Normal rate, regular rhythm and normal heart sounds.   Pulmonary/Chest: Effort normal and breath sounds normal.  Abdominal: Soft. Bowel sounds are normal. She exhibits no distension and no mass. There is no tenderness. There is no rebound and no guarding.  Lymphadenopathy:    She has no cervical adenopathy.  Neurological: She is alert and oriented to person, place, and time.  Skin: Skin is dry.  Psychiatric: She has a normal mood and affect. Her behavior  is normal.          Assessment & Plan:  Marland Kitchen.Marland Kitchen.Teresa Rojas was seen today for establish care.  Diagnoses and all orders for this visit:  Routine physical examination -     Lipid panel -     COMPLETE METABOLIC PANEL WITH GFR -     TSH -     Thyroid antibodies  Screening for diabetes mellitus -     COMPLETE METABOLIC PANEL WITH GFR  Screening for lipid disorders -     Lipid panel  No energy -     TSH -     Thyroid antibodies  Blind right eye  GAD (generalized anxiety disorder) -     citalopram (CELEXA) 20 MG tablet; Take 1 tablet (20  mg total) by mouth daily.   Form filled out for patient.  celexa refilled.  Discussed melatonin for sleep.

## 2016-03-06 NOTE — Patient Instructions (Signed)

## 2016-03-19 LAB — LIPID PANEL
Cholesterol: 197 mg/dL
HDL: 86 mg/dL
LDL Cholesterol: 100 mg/dL — ABNORMAL HIGH
Total CHOL/HDL Ratio: 2.3 ratio
Triglycerides: 57 mg/dL
VLDL: 11 mg/dL

## 2016-03-19 LAB — COMPLETE METABOLIC PANEL WITHOUT GFR
ALT: 15 U/L (ref 6–29)
AST: 15 U/L (ref 10–30)
Albumin: 4.4 g/dL (ref 3.6–5.1)
Alkaline Phosphatase: 77 U/L (ref 33–115)
BUN: 14 mg/dL (ref 7–25)
CO2: 27 mmol/L (ref 20–31)
Calcium: 9.3 mg/dL (ref 8.6–10.2)
Chloride: 103 mmol/L (ref 98–110)
Creat: 0.67 mg/dL (ref 0.50–1.10)
GFR, Est African American: >89 mL/min
GFR, Est Non African American: >89 mL/min
Glucose, Bld: 81 mg/dL (ref 65–99)
Potassium: 4.1 mmol/L (ref 3.5–5.3)
Sodium: 140 mmol/L (ref 135–146)
Total Bilirubin: 0.4 mg/dL (ref 0.2–1.2)
Total Protein: 7.3 g/dL (ref 6.1–8.1)

## 2016-03-19 LAB — TSH: TSH: 1.49 m[IU]/L

## 2016-03-19 LAB — THYROID ANTIBODIES (THYROPEROXIDASE & THYROGLOBULIN)
Thyroglobulin Ab: <1 [IU]/mL
Thyroperoxidase Ab SerPl-aCnc: <1 [IU]/mL

## 2016-06-08 ENCOUNTER — Encounter: Payer: Self-pay | Admitting: Physician Assistant

## 2016-06-08 LAB — CBC
HCT: 41
HGB: 13.4 g/dL
MCHC: 32.6
MCV: 87.1
Platelets: 215
RBC: 4.72
RDW: 14.4
WBC: 5.4

## 2016-08-11 IMAGING — US US MFM OB DETAIL+14 WK
1 series · 13 of 28 positions shown · non-contrast
Comparison: none

[Series 1: us mfm ob detail+14 wk · 13 of 90 slices shown]
[im 4/90]
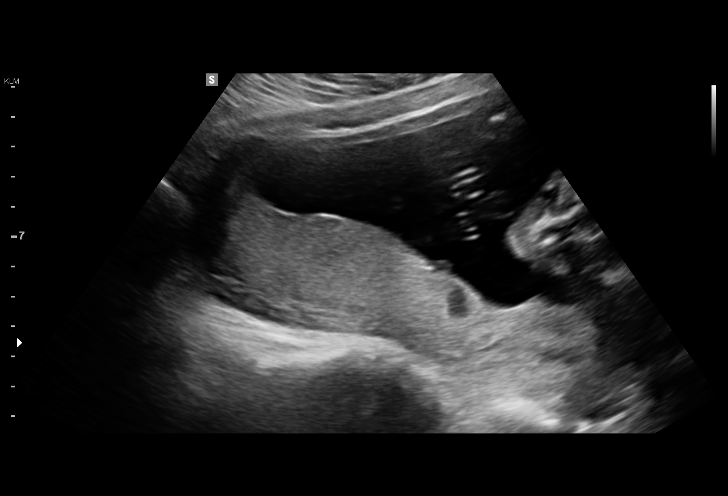
[im 10/90]
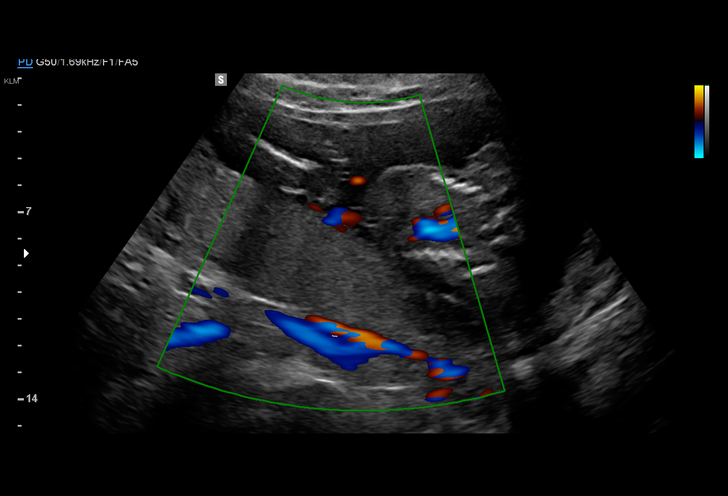
[im 17/90]
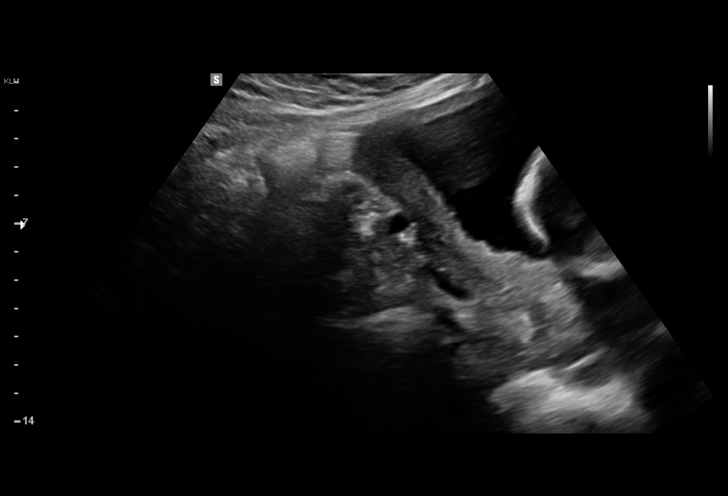
[im 24/90]
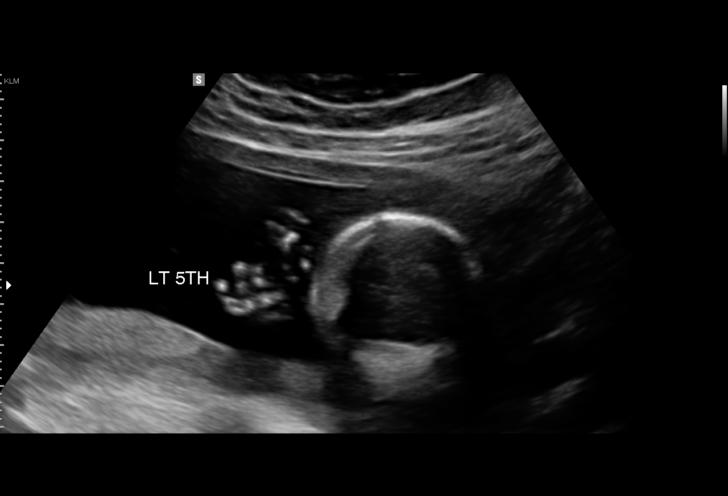
[im 30/90]
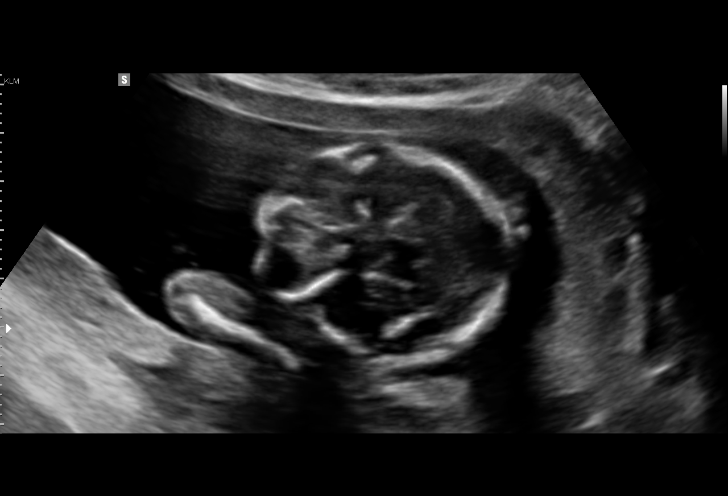
[im 37/90]
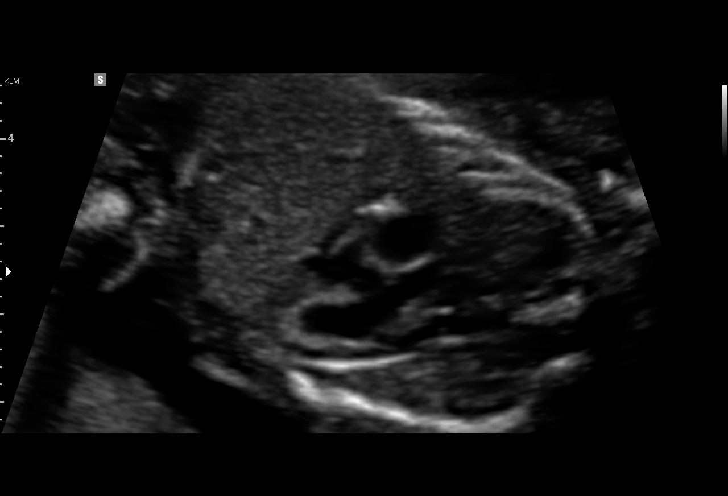
[im 47/90]
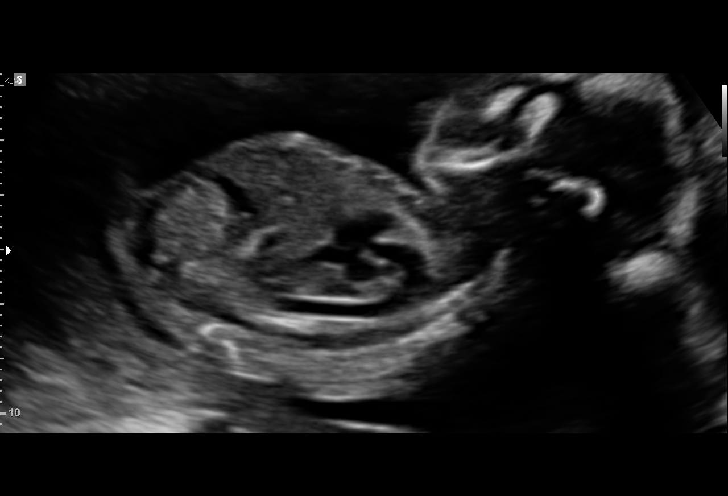
[im 53/90]
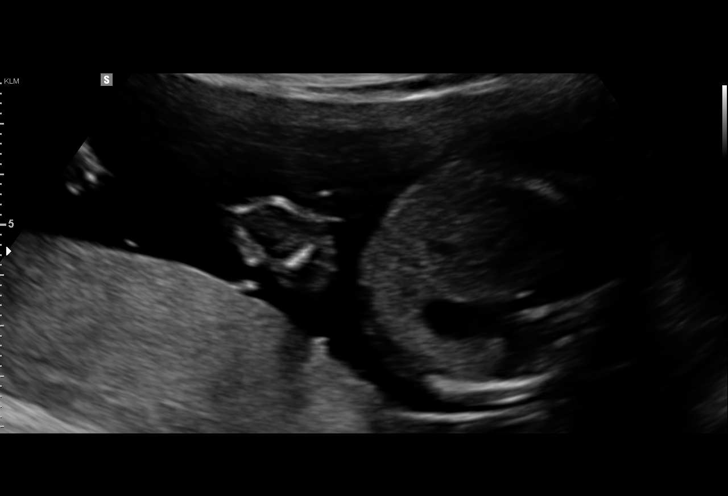
[im 60/90]
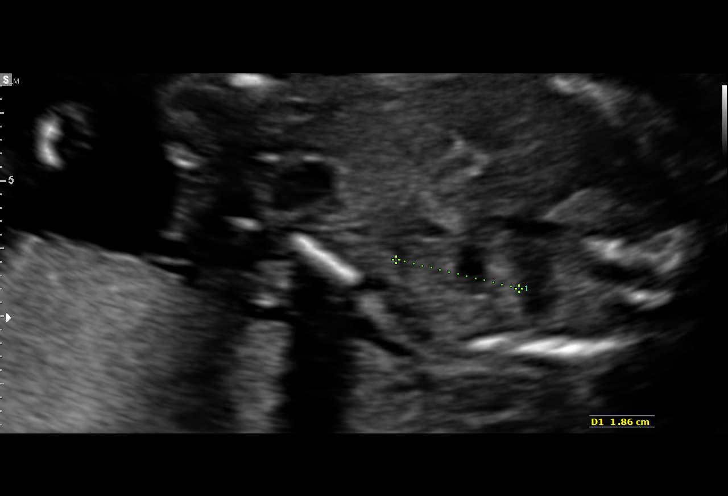
[im 66/90]
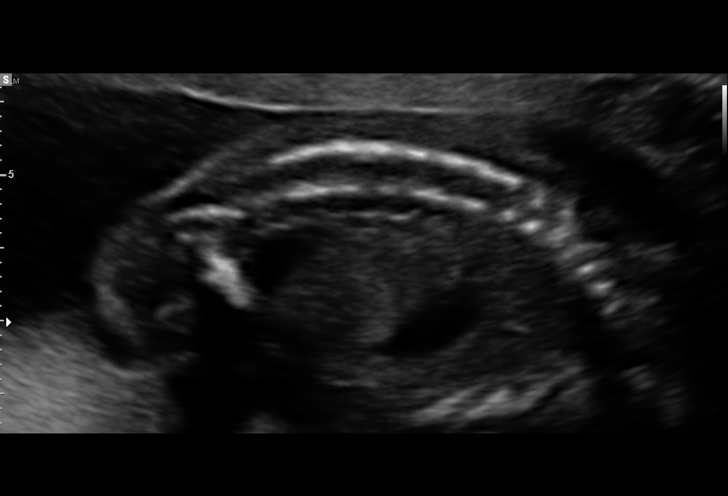
[im 73/90]
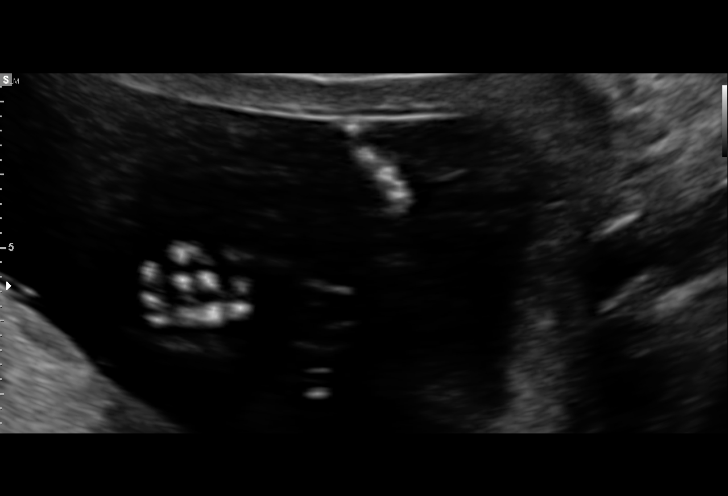
[im 80/90]
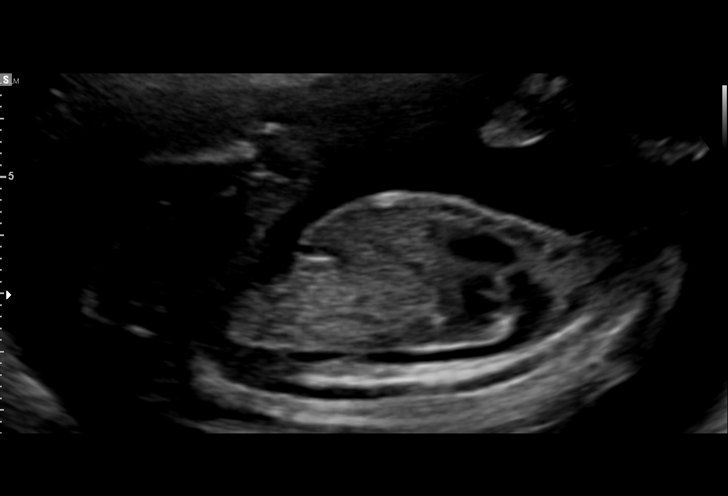
[im 86/90]
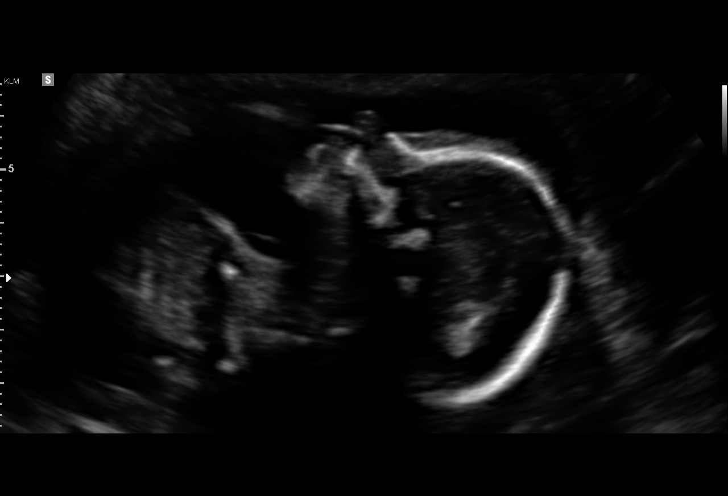

[13 of 28 positions shown; findings below may reference images not displayed]

[REDACTED]-
Faculty Physician

1  JUNIOR DZUL            [id number]      [id number]     [id number]
Indications

19 weeks gestation of pregnancy
Subchorionic hemorrhage, antepartum
Detailed fetal anatomic survey                 Z36
OB History

Gravidity:    2         Term:   1        Prem:   0        SAB:   0
TOP:          0       Ectopic:  0        Living: 1
Fetal Evaluation

Num Of Fetuses:     1
Fetal Heart         151
Rate(bpm):
Cardiac Activity:   Observed
Presentation:       Cephalic
Placenta:           Posterior Previa
P. Cord Insertion:  Visualized

Amniotic Fluid
AFI FV:      Subjectively within normal limits

Largest Pocket(cm)
4.3
Biometry
BPD:        44  mm     G. Age:  19w 2d         45  %    CI:        76.65   %   70 - 86
FL/HC:      18.5   %   16.1 -
HC:      159.2  mm     G. Age:  18w 6d         16  %    HC/AC:      1.09       1.09 -
AC:      146.3  mm     G. Age:  20w 0d         62  %    FL/BPD:     66.8   %
FL:       29.4  mm     G. Age:  19w 0d         30  %    FL/AC:      20.1   %   20 - 24
HUM:      28.9  mm     G. Age:  19w 3d         50  %

Est. FW:     293  gm    0 lb 10 oz      47  %
Gestational Age

U/S Today:     19w 2d                                        EDD:   12/30/15
Best:          19w 3d    Det. By:   Early Ultrasound         EDD:   12/29/15
(06/03/15)
Anatomy

Cranium:               Appears normal         Aortic Arch:            Appears normal
Cavum:                 Appears normal         Ductal Arch:            Appears normal
Ventricles:            Appears normal         Diaphragm:              Appears normal
Choroid Plexus:        Left choroid           Stomach:                Appears normal, left
plexus cyst
sided
Cerebellum:            Appears normal         Abdomen:                Appears normal
Posterior Fossa:       Appears normal         Abdominal Wall:         Appears nml (cord
insert, abd wall)
Nuchal Fold:           Appears normal         Cord Vessels:           Appears normal (3
vessel cord)
Face:                  Appears normal         Kidneys:                Appear normal
(orbits and profile)
Lips:                  Appears normal         Bladder:                Appears normal
Thoracic:              Appears normal         Spine:                  Ltd views no
intracranial signs of
NTD
Heart:                 Appears normal         Upper Extremities:      Appears normal
(4CH, axis, and
situs)
RVOT:                  Appears normal         Lower Extremities:      Appears normal
LVOT:                  Appears normal

Other:  Fetus appears to be a male. Heels and 5th digit visualized. Nasal
bone visualized. Technically difficult due to maternal habitus and fetal
position.
Cervix Uterus Adnexa

Cervix
Length:              4  cm.
Normal appearance by transabdominal scan.

Uterus
No abnormality visualized.

Left Ovary
Within normal limits. No adnexal mass visualized.

Right Ovary
Not visualized. No adnexal mass visualized.

Cul De Sac:   No free fluid seen.

Adnexa:       No abnormality visualized.
Comments

Choroid plexus cysts were noted.  This is a common variant
(1-2%) of all pregnancies, and carries a small association
with Trisomy 18.  However, there were no other ultrasound
stigmata of Trisomy 18, making the possibility of Trisomy 18
less likely.
Impression

Single IUP at 19w 2d
A left choroid plexus cyst is noted (see comments)
Limited views of the fetal spine were obtained
The remainder of the anatomy appears normal
Open hands visualized
No other markers associated with aneuploidy were noted
A posterior placenta previa is appreciated.  The previously
noted subchorionic hemorrhage has mostly resolved.
Normal amniotic fluid volume
Recommendations

Recommend follow-up ultrasound examination in 4-6 weeks
to reevaluate the fetal spine.

## 2016-08-19 ENCOUNTER — Other Ambulatory Visit: Payer: Self-pay | Admitting: *Deleted

## 2016-08-19 DIAGNOSIS — F411 Generalized anxiety disorder: Secondary | ICD-10-CM

## 2016-08-19 MED ORDER — CITALOPRAM HYDROBROMIDE 20 MG PO TABS: 20.0000 mg | ORAL_TABLET | Freq: Every day | ORAL | 0 refills | Status: DC

## 2016-09-07 IMAGING — US US MFM OB TRANSVAGINAL
2 series · 13 of 28 positions shown · non-contrast
Comparison: none

[Series 1: us mfm ob transvaginal · 12 of 66 slices shown (1 of 2)]
[im 3/66]
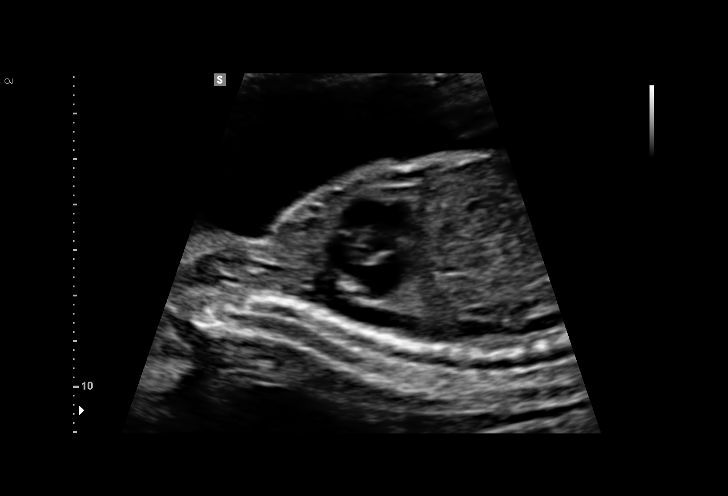
[im 8/66]
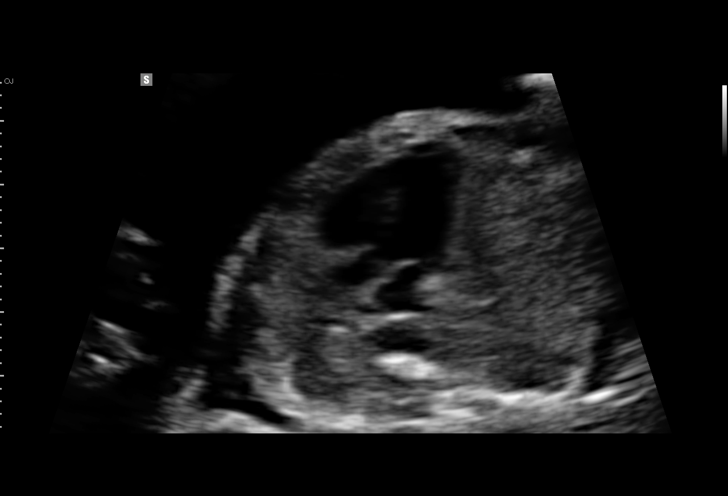
[im 14/66]
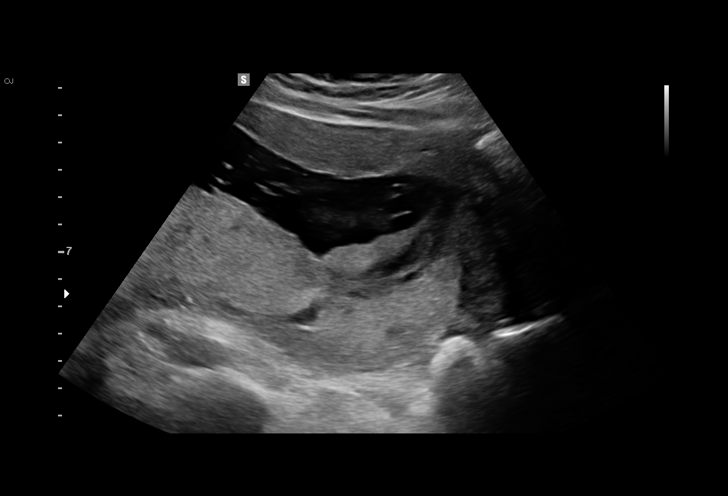
[im 19/66]
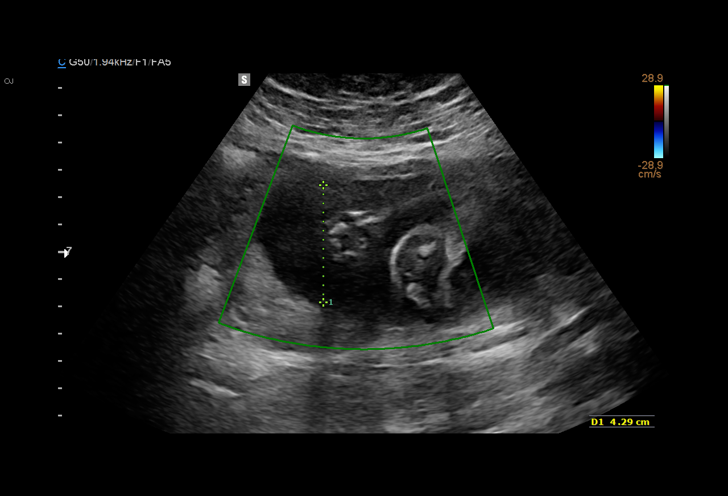
[im 24/66]
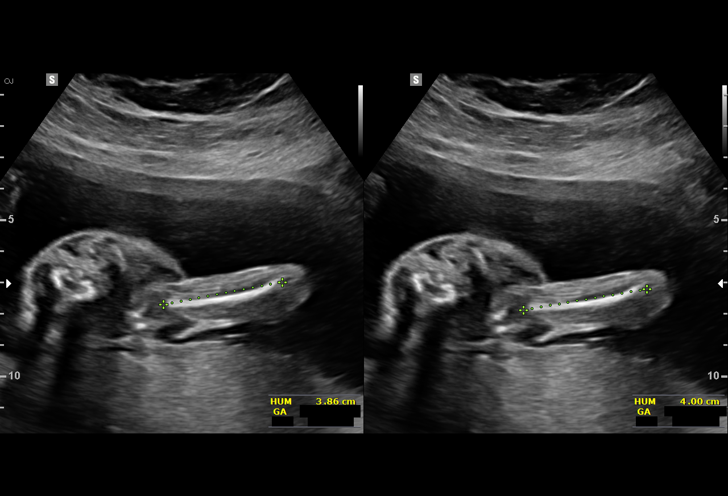
[im 29/66]
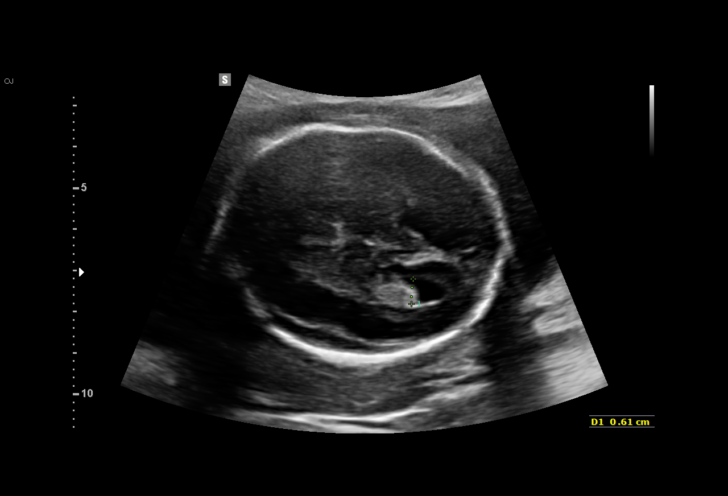
[im 37/66]
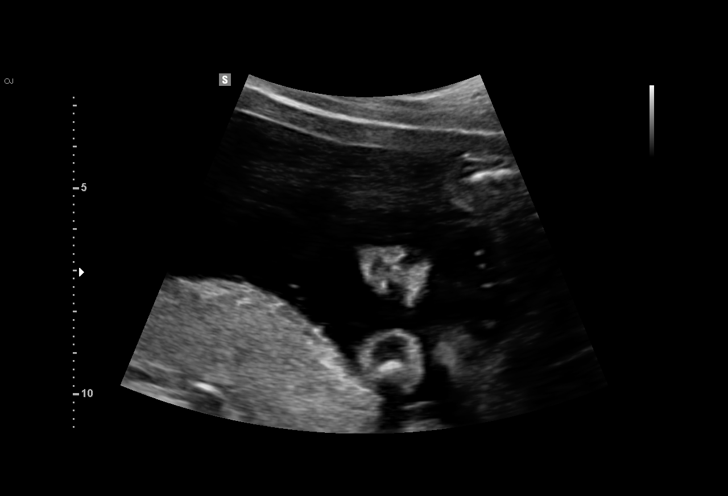
[im 42/66]
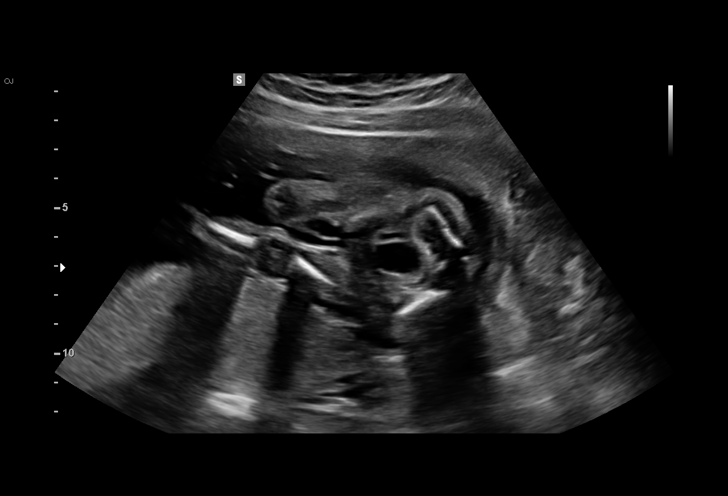
[im 47/66]
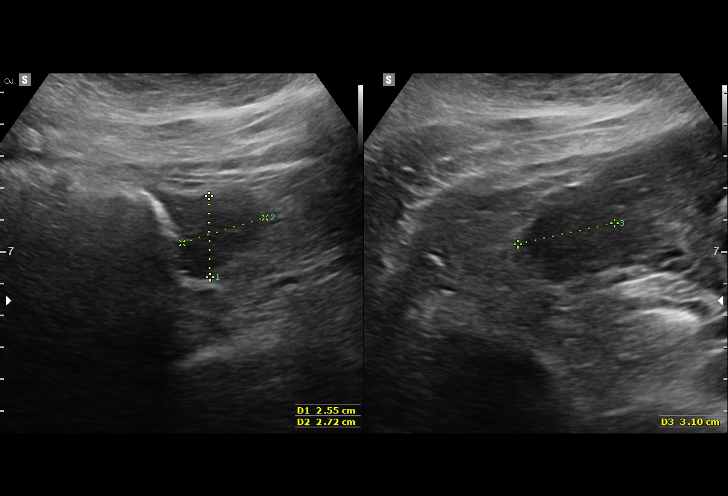
[im 53/66]
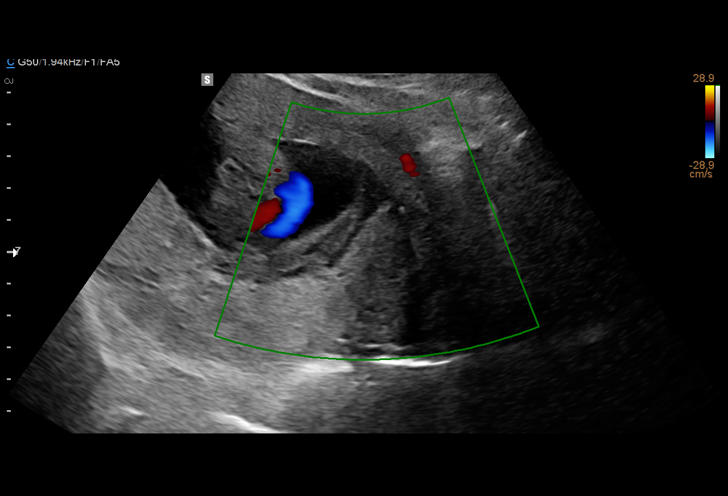
[im 58/66]
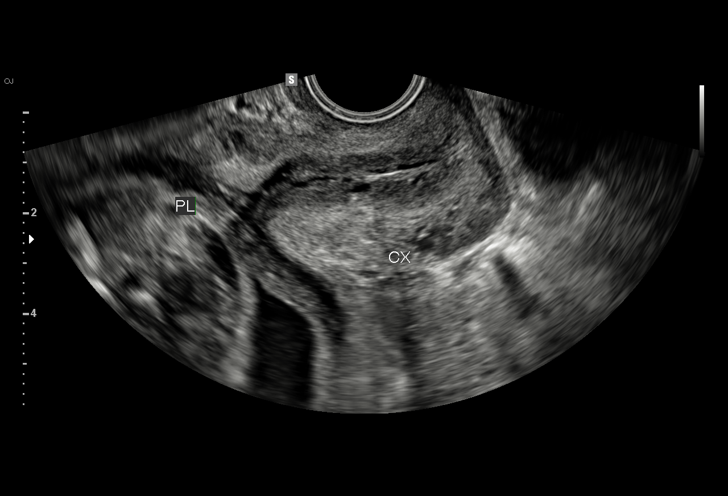
[im 63/66]
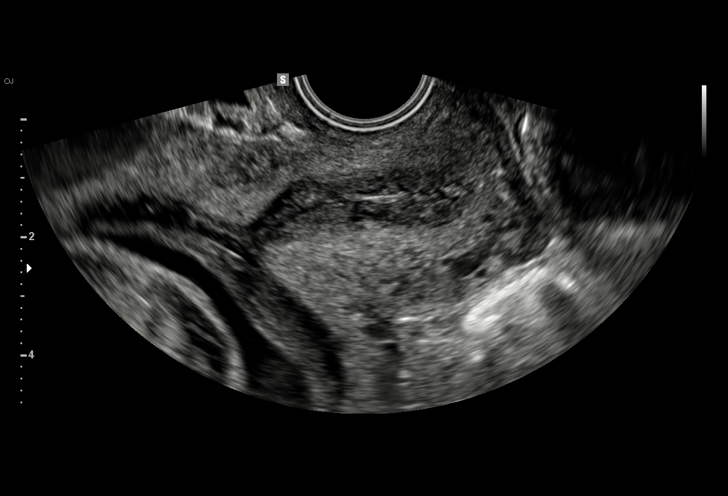

[Series 2: us mfm ob transvaginal · 1 of 6 slices shown (2 of 2)]
[im 1/6]
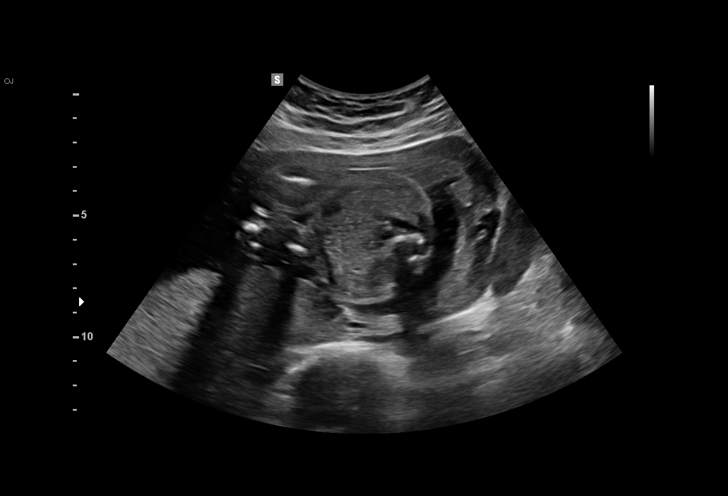

[13 of 28 positions shown; findings below may reference images not displayed]

[REDACTED]-
Faculty Physician

1  KOKAS SAGRADAS            [id number]      [id number]     [id number]
2  KOKAS SAGRADAS            [id number]      [id number]     [id number]
Indications

23 weeks gestation of pregnancy
Follow-up incomplete fetal anatomic            Z36
evaluation
Placenta previa with hemorrhage, second
trimester
Fetal abnormality - other known or
suspected (i.e. choriod plexus cyst)
OB History

Gravidity:    2         Term:   1        Prem:   0        SAB:   0
TOP:          0       Ectopic:  0        Living: 1
Fetal Evaluation

Num Of Fetuses:     1
Fetal Heart         162
Rate(bpm):
Cardiac Activity:   Observed
Presentation:       Breech
Placenta:           Posterior Previa
P. Cord Insertion:  Marginal insertion

Amniotic Fluid
AFI FV:      Subjectively within normal limits
Largest Pocket(cm)
4.3
Biometry

BPD:      55.2  mm     G. Age:  22w 6d         28  %    CI:        72.56   %    70 - 86
FL/HC:      18.6   %    19.2 -
HC:      206.1  mm     G. Age:  22w 5d         17  %    HC/AC:      1.06        1.05 -
AC:      194.2  mm     G. Age:  24w 1d         68  %    FL/BPD:     69.4   %    71 - 87
FL:       38.3  mm     G. Age:  22w 2d         12  %    FL/AC:      19.7   %    20 - 24
HUM:      38.8  mm     G. Age:  23w 6d         53  %
CER:      24.8  mm     G. Age:  22w 6d         42  %

CM:        5.9  mm

Est. FW:     575  gm      1 lb 4 oz     52  %
Gestational Age

U/S Today:     23w 0d                                        EDD:   12/31/15
Best:          23w 2d     Det. By:  Early Ultrasound         EDD:   12/29/15
(06/03/15)
Anatomy

Cranium:               Appears normal         Aortic Arch:            Previously seen
Cavum:                 Appears normal         Ductal Arch:            Previously seen
Ventricles:            Appears normal         Diaphragm:              Appears normal
Choroid Plexus:        Resolved CPC           Stomach:                Appears normal, left
sided
Cerebellum:            Appears normal         Abdomen:                Appears normal
Posterior Fossa:       Appears normal         Abdominal Wall:         Previously seen
Nuchal Fold:           Previously seen        Cord Vessels:           Appears normal (3
vessel cord)
Face:                  Orbits and profile     Kidneys:                Appear normal
previously seen
Lips:                  Appears normal         Bladder:                Appears normal
Thoracic:              Appears normal         Spine:                  Limited views
previously seen
Heart:                 Appears normal         Upper Extremities:      Previously seen
(4CH, axis, and
situs)
RVOT:                  Appears normal         Lower Extremities:      Previously seen
LVOT:                  Appears normal

Other:  Fetus appears to be a male. Heels and 5th digit prev. visualized.
Nasal bone prev.visualized. Technically difficult due to maternal
habitus and fetal position.
Cervix Uterus Adnexa

Cervix
Length:            3.7  cm.
Normal appearance by transabdominal scan.

Left Ovary
Within normal limits.

Right Ovary
Within normal limits.
Comments

Echogenic material was noted floating in the amniotic fluid
cavity.  As Ms. Sajini reports ongoing light bleeding since
the first trimester, It most likely represents intrauterine blood.
This is not an immediate cause for alarm.  I reviewed the
need for pelvic rest and discussed bleeding precautions with
Ms. Sajini again today.
Impression

Single living intrauterine pregnancy at 23 weeks 2 days.
Appropriate interval fetal growth (52%).
Normal amniotic fluid volume.
Normal interval fetal anatomy.
Persistent posterior marginal previa.
Intra-amniotic bleeding noted (see comments).
Resolved choroid plexus cysts.
Recommendations

Recommend follow-up ultrasound examination in 8 weeks to
reassess placental location.

## 2016-11-19 ENCOUNTER — Other Ambulatory Visit: Payer: Self-pay | Admitting: Physician Assistant

## 2016-11-19 DIAGNOSIS — F411 Generalized anxiety disorder: Secondary | ICD-10-CM

## 2016-11-19 IMAGING — US US MFM FETAL BPP W/O NON-STRESS
1 series · 14 of 28 positions shown · non-contrast
Comparison: none

[Series 1: us mfm fetal bpp w/o non-stress · 51 acquisitions, 14 frames shown]
[im 2/51]
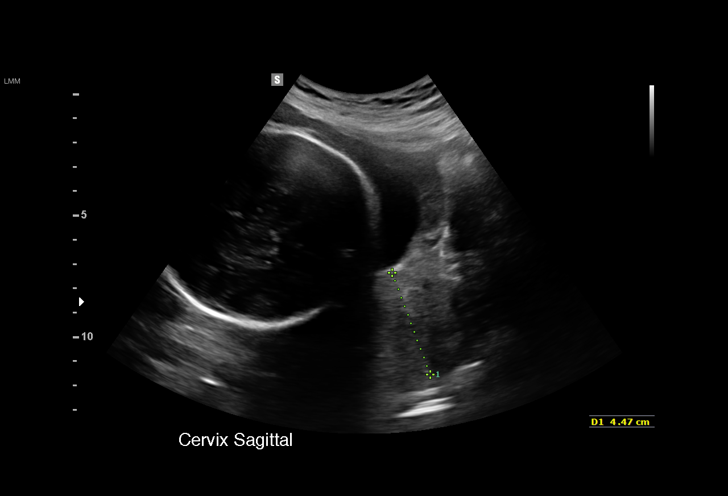
[im 6/51]
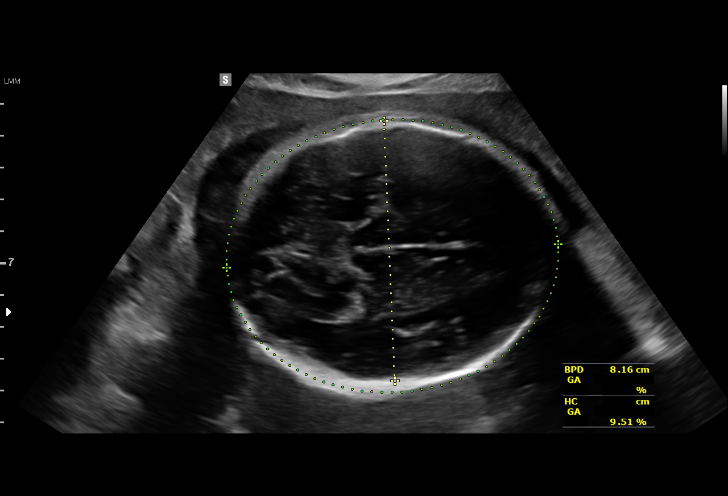
[im 10/51]
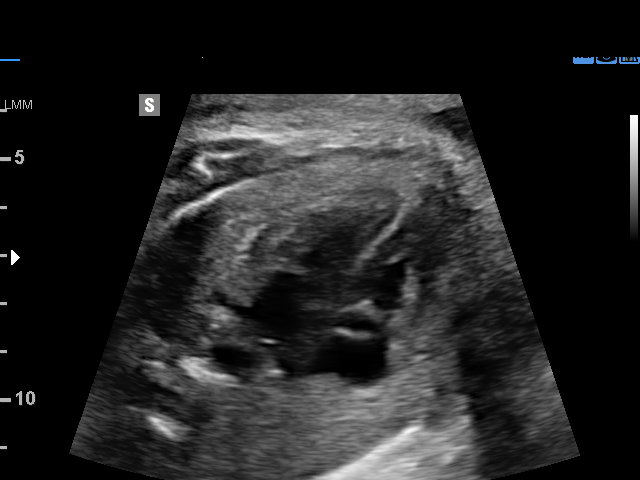
[im 13/51]
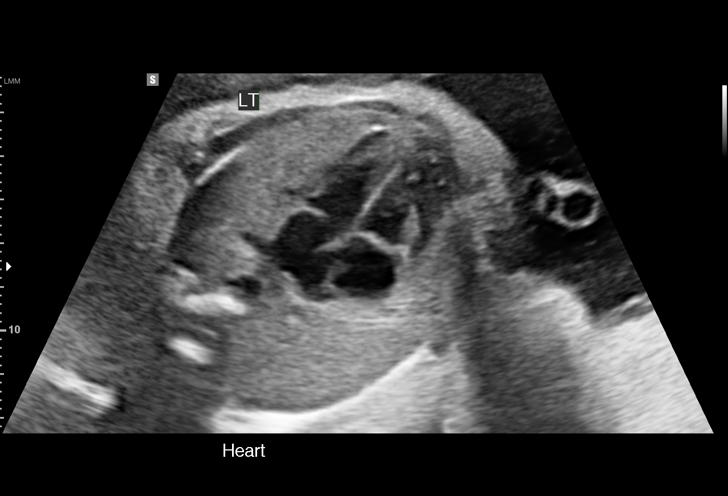
[im 17/51]
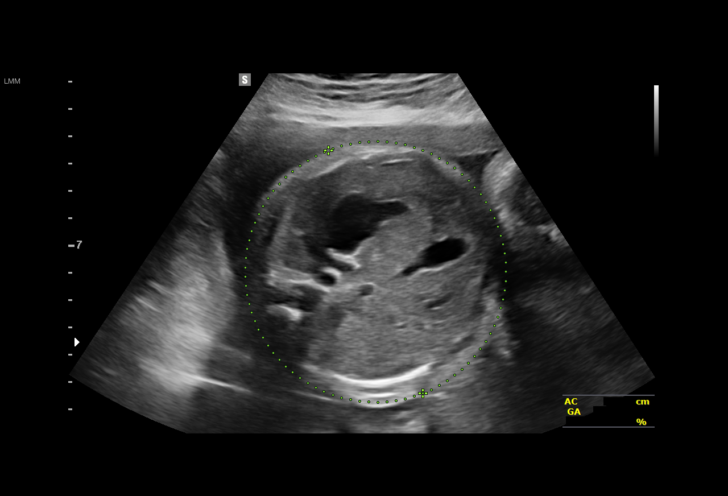
[im 21/51]
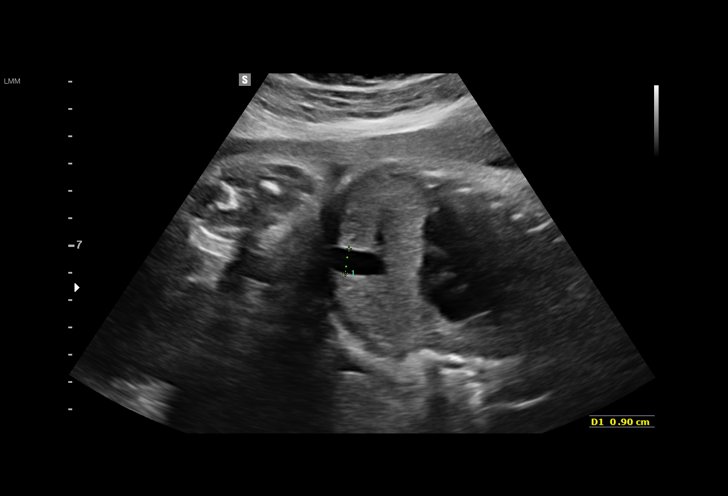
[im 25/51]
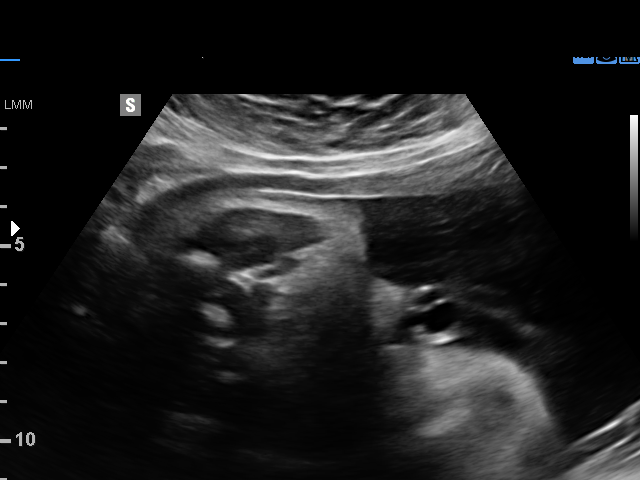
[im 28/51]
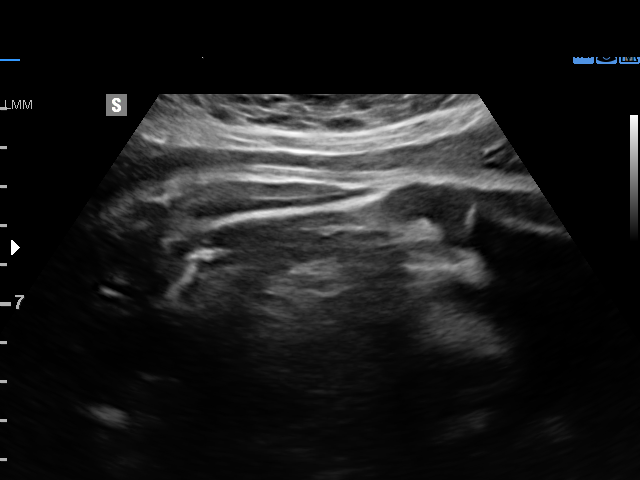
[im 32/51]
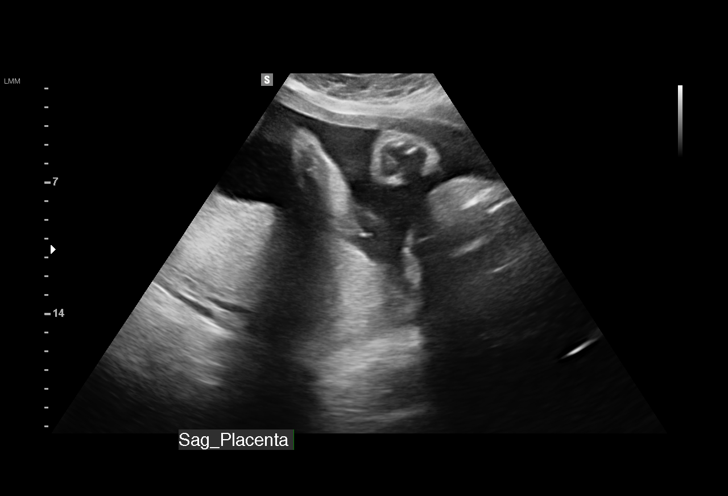
[im 36/51]
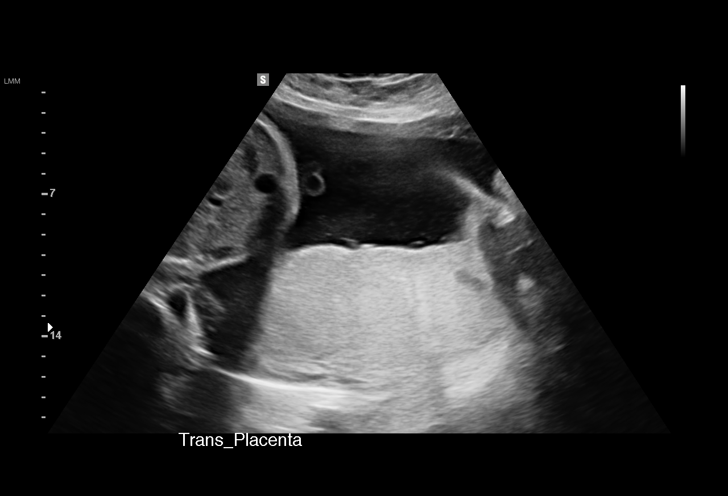
[im 39/51]
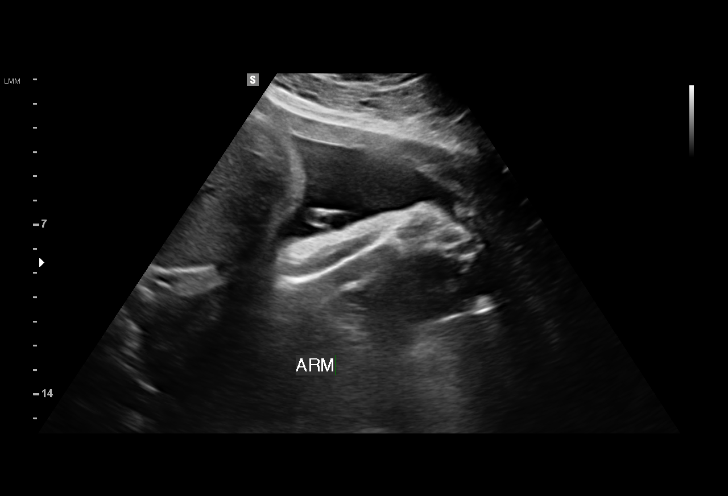
[im 43/51]
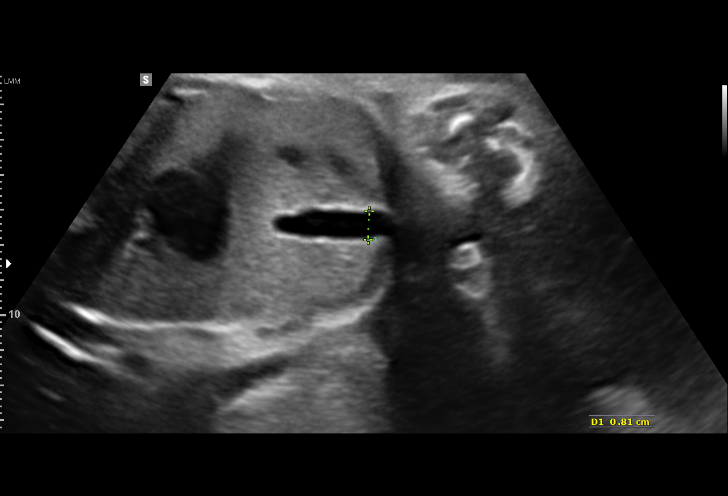
[im 47/51]
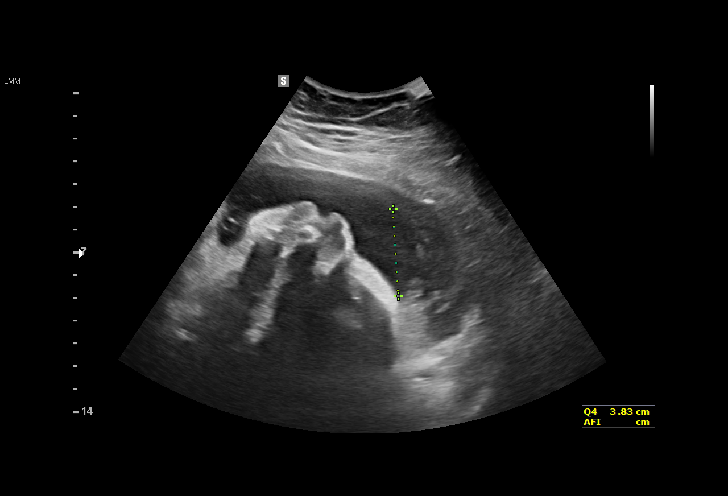
[im 51/51]
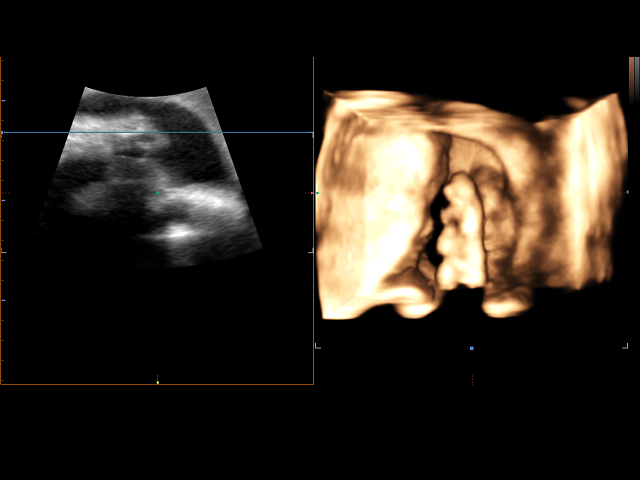

[14 of 28 positions shown; findings below may reference images not displayed]

[REDACTED]

1  WILI BUHLER            [id number]      [id number]     [id number]
2  WILI BUHLER            [id number]      [id number]     [id number]
Indications

33 weeks gestation of pregnancy
Placental abruption - chronic; 2
hospitalizations
OB History

Gravidity:    2         Term:   1        Prem:   0         SAB:   0
TOP:          0       Ectopic:  0        Living: 1
Fetal Evaluation

Num Of Fetuses:     1
Fetal Heart         140
Rate(bpm):
Cardiac Activity:   Observed
Presentation:       Cephalic
Placenta:           Posterior, above cervical os
P. Cord Insertion:  Previously Visualized

Amniotic Fluid
AFI FV:      Subjectively within normal limits

AFI Sum(cm)     %Tile       Largest Pocket(cm)
14.21           50
RUQ(cm)       RLQ(cm)       LUQ(cm)        LLQ(cm)
1.04
Biophysical Evaluation

Amniotic F.V:   Pocket => 2 cm two         F. Tone:         Observed
planes
F. Movement:    Observed                   Score:           [DATE]
F. Breathing:   Observed
Biometry

BPD:      81.5  mm     G. Age:  32w 5d         20  %    CI:         74.63  %    70 - 86
FL/HC:       19.5  %    19.4 -
HC:      299.4  mm     G. Age:  33w 1d          9  %    HC/AC:       0.99       0.96 -
AC:      303.7  mm     G. Age:  34w 3d         70  %    FL/BPD:      71.5  %    71 - 87
FL:       58.3  mm     G. Age:  30w 3d        < 3  %    FL/AC:       19.2  %    20 - 24
HUM:      52.8  mm     G. Age:  30w 5d        < 5  %

Est. FW:    1712   gm    4 lb 10 oz     44  %
Gestational Age

U/S Today:     32w 5d                                        EDD:    01/05/16
Best:          33w 5d     Det. By:  Early Ultrasound         EDD:    12/29/15
(06/03/15)
Anatomy

Cranium:               Appears normal         Aortic Arch:            Previously seen
Cavum:                 Appears normal         Ductal Arch:            Appears normal
Ventricles:            Appears normal         Diaphragm:              Appears normal
Choroid Plexus:        Resolved CPC           Stomach:                Appears normal, left
sided
Cerebellum:            Previously seen        Abdomen:                Appears normal
Posterior Fossa:       Previously seen        Abdominal Wall:         Appears nml (cord
insert, abd wall)
Nuchal Fold:           Previously seen        Cord Vessels:           Appears normal (3
vessel cord)
Face:                  Appears normal         Kidneys:                Appear normal
(orbits and profile)
Lips:                  Appears normal         Bladder:                Appears normal
Thoracic:              Appears normal         Spine:                  Limited views
previously seen
Heart:                 Appears normal         Upper Extremities:      Previously seen
(4CH, axis, and
situs)
RVOT:                  Previously seen        Lower Extremities:      Previously seen
LVOT:                  Appears normal

Other:  Male gender previously seen. Heels and 5th digit previously
visualized. Nasal bone previously.visualized.
Impression

SIUP at 33+5 weeks
Normal interval anatomy; anatomic survey complete
Normal amniotic fluid volume
Appropriate interval growth with EFW at the 44th %tile
BPP [DATE]
Recommendations

Continue twice weekly NSTs with weekly AFIs
Agree with plans for a 37 weeks delivery

## 2016-12-04 ENCOUNTER — Other Ambulatory Visit: Payer: Self-pay | Admitting: Physician Assistant

## 2016-12-04 ENCOUNTER — Encounter: Payer: Self-pay | Admitting: Physician Assistant

## 2016-12-04 ENCOUNTER — Ambulatory Visit (INDEPENDENT_AMBULATORY_CARE_PROVIDER_SITE_OTHER): Payer: 59 | Admitting: Physician Assistant

## 2016-12-04 VITALS — BP 127/80 | HR 73 | Wt 241.0 lb

## 2016-12-04 DIAGNOSIS — F411 Generalized anxiety disorder: Secondary | ICD-10-CM

## 2016-12-04 DIAGNOSIS — Z23 Encounter for immunization: Secondary | ICD-10-CM | POA: Diagnosis not present

## 2016-12-04 DIAGNOSIS — Z6837 Body mass index (BMI) 37.0-37.9, adult: Secondary | ICD-10-CM

## 2016-12-04 MED ORDER — CITALOPRAM HYDROBROMIDE 20 MG PO TABS: ORAL_TABLET | ORAL | 4 refills | Status: DC

## 2016-12-04 NOTE — Progress Notes (Signed)
   Subjective:    Patient ID: Teresa Rojas, female    DOB: 08/17/1980, 36 y.o.   MRN: [id number]  HPI Pt is a 36 yo female who presents to the clinic for follow up and medication refill.   GAD- doing great. On celexa. No side effects. She feels so much better on medication. She is doing well at work and home.  .. Active Ambulatory Problems    Diagnosis Date Noted  . Supervision of normal pregnancy, antepartum 06/03/2015  . Obesity in pregnancy, antepartum 06/03/2015  . Second trimester bleeding 07/02/2015  . Choroid plexus cyst of fetus on prenatal ultrasound 08/16/2015  . Antepartum placental abruption 10/23/2015  . Placental abruption in third trimester 12/08/2015  . Status post vaginal delivery 12/09/2015  . 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 No energy 03/06/2016  . GAD (generalized anxiety disorder) 03/06/2016  . BMI 37.0-37.9, adult 12/04/2016   Resolved Ambulatory Problems    Diagnosis Date Noted  . Supervision of normal first pregnancy in first trimester 02/03/2013  . Rubella non-immune status, antepartum 02/07/2013  . Low-lying placenta 06/01/2013  . Supervision of normal first pregnancy 07/19/2013  . PROM (premature rupture of membranes) 09/10/2013  . IUD (intrauterine device) in place 10/17/2013  . Placenta previa antepartum 08/16/2015  . Placenta previa antepartum in second trimester 09/20/2015  . Placenta previa with hemorrhage in second trimester 09/20/2015   Past Medical History:  Diagnosis Date  . 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 Vision loss       Review of Systems  All other systems reviewed and are negative.      Objective:   Physical Exam  Constitutional: She is oriented to person, place, and time. She appears well-developed and well-nourished.  HENT:  Head: Normocephalic and atraumatic.  Cardiovascular: Normal rate, regular rhythm and normal heart sounds.   Pulmonary/Chest: Effort normal and breath sounds normal.  Neurological: She is  alert and oriented to person, place, and time.  Psychiatric: She has a normal mood and affect. Her behavior is normal.          Assessment & Plan:  Marland Kitchen.Marland Kitchen.Teresa Rojas was seen today for f/u anxiety.  Diagnoses and all orders for this visit:  GAD (generalized anxiety disorder) -     citalopram (CELEXA) 20 MG tablet; TAKE 1 TABLET BY MOUTH EVERY DAY.  Need for immunization against influenza -     Flu Vaccine QUAD 36+ mos IM  BMI 37.0-37.9, adult       GAD 7 : Generalized Anxiety Score 12/04/2016  Nervous, Anxious, on Edge 1  Control/stop worrying 1  Worry too much - different things 0  Trouble relaxing 0  Restless 0  Easily annoyed or irritable 1  Afraid - awful might happen 0  Total GAD 7 Score 3   Refilled for 1 year.   .Discussed low carb diet with 1500 calories and 80g of protein.  Exercising at least 150 minutes a week.  My Fitness Pal could be a [employer] resource.

## 2016-12-07 ENCOUNTER — Other Ambulatory Visit: Payer: Self-pay | Admitting: Physician Assistant

## 2016-12-07 DIAGNOSIS — F411 Generalized anxiety disorder: Secondary | ICD-10-CM

## 2017-11-08 ENCOUNTER — Ambulatory Visit (INDEPENDENT_AMBULATORY_CARE_PROVIDER_SITE_OTHER): Payer: BLUE CROSS/BLUE SHIELD | Admitting: Family Medicine

## 2017-11-08 ENCOUNTER — Encounter: Payer: Self-pay | Admitting: Family Medicine

## 2017-11-08 VITALS — BP 105/74 | HR 78 | Temp 98.7°F | Ht 67.0 in | Wt 235.0 lb

## 2017-11-08 DIAGNOSIS — W57XXXA Bitten or stung by nonvenomous insect and other nonvenomous arthropods, initial encounter: Secondary | ICD-10-CM | POA: Diagnosis not present

## 2017-11-08 DIAGNOSIS — J020 Streptococcal pharyngitis: Secondary | ICD-10-CM

## 2017-11-08 DIAGNOSIS — J029 Acute pharyngitis, unspecified: Secondary | ICD-10-CM | POA: Diagnosis not present

## 2017-11-08 LAB — POCT RAPID STREP A (OFFICE): Rapid Strep A Screen: POSITIVE — AB

## 2017-11-08 MED ORDER — PENICILLIN V POTASSIUM 500 MG PO TABS: 500.0000 mg | ORAL_TABLET | Freq: Two times a day (BID) | ORAL | 0 refills | Status: AC

## 2017-11-08 NOTE — Progress Notes (Signed)
Subjective:    Patient ID: Teresa Rojas, female    DOB: 06/29/1980, 37 y.o.   MRN: [id number]  HPI 37 year old female comes in today on day 6 of upper respiratory symptoms that include nasal congestion and sore throat.  She was worried about possibility of strep throat.  She says it started with a sore throat and then it kind of got better for about a day and then got worse again.  She said yesterday she felt so exhausted she spent most of the day in bed.  No fevers chills or sweats.  She had a slight cough from postnasal drip but no other chest symptoms such as shortness of breath or wheezing.  She also has a tick bite on her left lower abdomen that happened [place] and she just wanted me to take a look at it.  She never developed any type of rash but says the lesion still stays itchy but its right at the waistline of her pants which tend to rub at it.  She is been placing a Band-Aid over it.   Review of Systems  BP 105/74   Pulse 78   Temp 98.7 F (37.1 C)   Ht 5\' 7"  (1.702 m)   Wt 235 lb (106.6 kg)   SpO2 99%   BMI 36.81 kg/m     No Known Allergies  Past Medical History:  Diagnosis Date  . 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 Vision loss     Past Surgical History:  Procedure Laterality Date  . WISDOM TOOTH EXTRACTION      Social History   Socioeconomic History  . Marital status: Married    Spouse name: Not on file  . Number of children: Not on file  . Years of education: Not on file  . Highest education level: Not on file  Occupational History  . Not on file  Social Needs  . Financial resource strain: Not on file  . Food insecurity:    Worry: Not on file    Inability: Not on file  . Transportation needs:    Medical: Not on file    Non-medical: Not on file  Tobacco Use  . Smoking status: Never Smoker  . Smokeless tobacco: Never Used  Substance and Sexual Activity  . Alcohol use: No  . Drug use: No  . Sexual activity: Yes    Partners: Male    Birth control/protection: None  Lifestyle  . Physical activity:    Days per week: Not on file    Minutes per session: Not on file  . Stress: Not on file  Relationships  . Social connections:    Talks on phone: Not on file    Gets together: Not on file    Attends religious service: Not on file    Active member of club or organization: Not on file    Attends meetings of clubs or organizations: Not on file    Relationship status: Not on file  . Intimate partner violence:    Fear of current or ex partner: Not on file    Emotionally abused: Not on file    Physically abused: Not on file    Forced sexual activity: Not on file  Other Topics Concern  . Not on file  Social History Narrative  . Not on file    Family History  Problem Relation Age of Onset  . Breast cancer Maternal Grandmother   . Breast cancer Paternal Grandmother  Outpatient Encounter Medications as of 11/08/2017  Medication Sig  . calcium carbonate (TUMS - DOSED IN MG ELEMENTAL CALCIUM) 500 MG chewable tablet Chew 1-2 tablets by mouth as needed for indigestion or heartburn.  . citalopram (CELEXA) 20 MG tablet TAKE 1 TABLET BY MOUTH EVERY DAY.  Marland Kitchen. ibuprofen (ADVIL,MOTRIN) 600 MG tablet Take 1 tablet (600 mg total) by mouth every 6 (six) hours.  . penicillin v potassium (VEETID) 500 MG tablet Take 1 tablet (500 mg total) by mouth 2 (two) times daily for 10 days.  . [DISCONTINUED] Prenatal Vit-Fe Fumarate-FA (MULTIVITAMIN-PRENATAL) 27-0.8 MG TABS tablet Take 1 tablet by mouth daily.    No facility-administered encounter medications on file as of 11/08/2017.          Objective:   Physical Exam  Constitutional: She is oriented to person, place, and time. She appears well-developed and well-nourished.  HENT:  Head: Normocephalic and atraumatic.  Right Ear: External ear normal.  Left Ear: External ear normal.  Nose: Nose normal.  Mouth/Throat: Oropharynx is clear and moist.  TMs and canals are clear.   Eyes:  Pupils are equal, round, and reactive to light. Conjunctivae and EOM are normal.  Neck: Neck supple. No thyromegaly present.  Cardiovascular: Normal rate, regular rhythm and normal heart sounds.  Pulmonary/Chest: Effort normal and breath sounds normal. She has no wheezes.  Lymphadenopathy:    She has no cervical adenopathy.  Neurological: She is alert and oriented to person, place, and time.  Skin: Skin is warm and dry.  Her left lower abdomen she has what looks like a dermatofibroma where the tick had bit her.  I think it is starting to form some scar tissue.  There is some significant bruising around the area.  But no open wound or drainage and no induration.  No cellulitis.  Psychiatric: She has a normal mood and affect.       Assessment & Plan:  Strep pharyngitis -strep test was positive that her exam is more consistent with a viral etiology.  We will go ahead and treat with penicillin.  Call if not better in 1 week.  Okay for symptomatic care including salt water gargles, running humidifier at night.  Tick bite-on left lower abdomen-it has a significant amount of bruising around it probably from where she is been scratching.  Recommended that she not wear the Band-Aid unless she is just doing that to keep from scratching it but she needs to minimize irritation to the area.  It does not look infected.  I do not think she has any sign of Lyme's disease etc.

## 2017-11-08 NOTE — Patient Instructions (Signed)
Strep Throat Strep throat is a bacterial infection of the throat. Your health care provider may call the infection tonsillitis or pharyngitis, depending on whether there is swelling in the tonsils or at the back of the throat. Strep throat is most common during the cold months of the year in children who are 5-36 years of age, but it can happen during any season in people of any age. This infection is spread from person to person (contagious) through coughing, sneezing, or close contact. What are the causes? Strep throat is caused by the bacteria called Streptococcus pyogenes. What increases the risk? This condition is more likely to develop in:  People who spend time in crowded places where the infection can spread easily.  People who have close contact with someone who has strep throat.  What are the signs or symptoms? Symptoms of this condition include:  Fever or chills.  Redness, swelling, or pain in the tonsils or throat.  Pain or difficulty when swallowing.  White or yellow spots on the tonsils or throat.  Swollen, tender glands in the neck or under the jaw.  Red rash all over the body (rare).  How is this diagnosed? This condition is diagnosed by performing a rapid strep test or by taking a swab of your throat (throat culture test). Results from a rapid strep test are usually ready in a few minutes, but throat culture test results are available after one or two days. How is this treated? This condition is treated with antibiotic medicine. Follow these instructions at home: Medicines  Take over-the-counter and prescription medicines only as told by your health care provider.  Take your antibiotic as told by your health care provider. Do not stop taking the antibiotic even if you start to feel better.  Have family members who also have a sore throat or fever tested for strep throat. They may need antibiotics if they have the strep infection. Eating and drinking  Do not  share food, drinking cups, or personal items that could cause the infection to spread to other people.  If swallowing is difficult, try eating soft foods until your sore throat feels better.  Drink enough fluid to keep your urine clear or pale yellow. General instructions  Gargle with a salt-water mixture 3-4 times per day or as needed. To make a salt-water mixture, completely dissolve -1 tsp of salt in 1 cup of warm water.  Make sure that all household members wash their hands well.  Get plenty of rest.  Stay home from school or work until you have been taking antibiotics for 24 hours.  Keep all follow-up visits as told by your health care provider. This is important. Contact a health care provider if:  The glands in your neck continue to get bigger.  You develop a rash, cough, or earache.  You cough up a thick liquid that is green, yellow-brown, or bloody.  You have pain or discomfort that does not get better with medicine.  Your problems seem to be getting worse rather than better.  You have a fever. Get help right away if:  You have new symptoms, such as vomiting, severe headache, stiff or painful neck, chest pain, or shortness of breath.  You have severe throat pain, drooling, or changes in your voice.  You have swelling of the neck, or the skin on the neck becomes red and tender.  You have signs of dehydration, such as fatigue, dry mouth, and decreased urination.  You become increasingly sleepy, or   you cannot wake up completely.  Your joints become red or painful. This information is not intended to replace advice given to you by your health care provider. Make sure you discuss any questions you have with your health care provider. Document Released: 03/13/2000 Document Revised: 11/13/2015 Document Reviewed: 07/09/2014 Elsevier Interactive Patient Education  2018 Elsevier Inc.  

## 2017-12-22 ENCOUNTER — Other Ambulatory Visit: Payer: Self-pay | Admitting: Physician Assistant

## 2017-12-22 DIAGNOSIS — F411 Generalized anxiety disorder: Secondary | ICD-10-CM

## 2017-12-24 DIAGNOSIS — Z23 Encounter for immunization: Secondary | ICD-10-CM | POA: Diagnosis not present

## 2018-01-21 ENCOUNTER — Other Ambulatory Visit: Payer: Self-pay | Admitting: Physician Assistant

## 2018-01-21 DIAGNOSIS — F411 Generalized anxiety disorder: Secondary | ICD-10-CM

## 2018-05-03 ENCOUNTER — Other Ambulatory Visit: Payer: Self-pay | Admitting: Physician Assistant

## 2018-05-03 DIAGNOSIS — F411 Generalized anxiety disorder: Secondary | ICD-10-CM

## 2018-06-28 ENCOUNTER — Other Ambulatory Visit: Payer: Self-pay

## 2018-06-28 ENCOUNTER — Ambulatory Visit (INDEPENDENT_AMBULATORY_CARE_PROVIDER_SITE_OTHER): Payer: BLUE CROSS/BLUE SHIELD | Admitting: Physician Assistant

## 2018-06-28 ENCOUNTER — Telehealth: Payer: Self-pay | Admitting: Physician Assistant

## 2018-06-28 ENCOUNTER — Other Ambulatory Visit: Payer: Self-pay | Admitting: Physician Assistant

## 2018-06-28 ENCOUNTER — Encounter: Payer: Self-pay | Admitting: Physician Assistant

## 2018-06-28 VITALS — Ht 67.0 in | Wt 230.0 lb

## 2018-06-28 DIAGNOSIS — F411 Generalized anxiety disorder: Secondary | ICD-10-CM | POA: Diagnosis not present

## 2018-06-28 MED ORDER — CITALOPRAM HYDROBROMIDE 40 MG PO TABS: 40.0000 mg | ORAL_TABLET | Freq: Every day | ORAL | 1 refills | Status: DC

## 2018-06-28 NOTE — Telephone Encounter (Signed)
scheduled

## 2018-06-28 NOTE — Telephone Encounter (Signed)
I called to schedule patient for follow up appointment. Unable to leave a message, voicemail is full.

## 2018-06-28 NOTE — Progress Notes (Signed)
Follow up on Celexa. Feels like over the last few weeks not as effective as it has been. Taking 20mg  once a day.REfill med or increase dose

## 2018-06-28 NOTE — Progress Notes (Signed)
Subjective:    Patient ID: Teresa Rojas, female    DOB: 20-Jan-1981, 38 y.o.   MRN: [id number]  HPI  Pt is a 38 yo female with GAD and on celexa for over 2 years who calls in via webex to get medication refills. We are in COVID-19 pandemic and anxiety has worsened. She is working full time with 2 children. She finds herself much more overwhelmed. She has really noticed that her anxiety and been worsening over the last 6 months. She wonders if we could increase her medication. She admits to not exercising like she should. She is trying to do more yoga. No SI/HC.   Marland Kitchen. Active Ambulatory Problems    Diagnosis Date Noted  . 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 No energy 03/06/2016  . GAD (generalized anxiety disorder) 03/06/2016  . BMI 37.0-37.9, adult 12/04/2016   Resolved Ambulatory Problems    Diagnosis Date Noted  . Supervision of normal first pregnancy in first trimester 02/03/2013  . Rubella non-immune status, antepartum 02/07/2013  . Low-lying placenta 06/01/2013  . Supervision of normal first pregnancy 07/19/2013  . PROM (premature rupture of membranes) 09/10/2013  . IUD (intrauterine device) in place 10/17/2013  . Supervision of normal pregnancy, antepartum 06/03/2015  . Obesity in pregnancy, antepartum 06/03/2015  . Second trimester bleeding 07/02/2015  . Placenta previa antepartum 08/16/2015  . Choroid plexus cyst of fetus on prenatal ultrasound 08/16/2015  . Placenta previa antepartum in second trimester 09/20/2015  . Placenta previa with hemorrhage in second trimester 09/20/2015  . Antepartum placental abruption 10/23/2015  . Placental abruption in third trimester 12/08/2015  . Status post vaginal delivery 12/09/2015   Past Medical History:  Diagnosis Date  . 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 Vision loss      Review of Systems  All other systems reviewed and are negative.     Objective:   Physical Exam Vitals signs reviewed.  Constitutional:    Appearance: Normal appearance.  Cardiovascular:     Rate and Rhythm: Normal rate.  Pulmonary:     Effort: Pulmonary effort is normal.  Neurological:     General: No focal deficit present.     Mental Status: She is alert and oriented to person, place, and time.  Psychiatric:        Mood and Affect: Mood normal.           Assessment & Plan:  Marland KitchenMarland KitchenDiagnoses and all orders for this visit:  GAD (generalized anxiety disorder) -     citalopram (CELEXA) 40 MG tablet; Take 1 tablet (40 mg total) by mouth daily.     .. Depression screen [facility] 2/9 06/28/2018 12/04/2016  Decreased Interest 3 0  Down, Depressed, Hopeless 0 0  PHQ - 2 Score 3 0  Altered sleeping 1 -  Tired, decreased energy 0 -  Change in appetite 3 -  Feeling bad or failure about yourself  0 -  Trouble concentrating 0 -  Moving slowly or fidgety/restless 0 -  Suicidal thoughts 0 -  PHQ-9 Score 7 -  Difficult doing work/chores Not difficult at all -   . GAD 7 : Generalized Anxiety Score 06/28/2018 12/04/2016  Nervous, Anxious, on Edge 3 1  Control/stop worrying 3 1  Worry too much - different things 3 0  Trouble relaxing 1 0  Restless 3 0  Easily annoyed or irritable 1 1  Afraid - awful might happen 1 0  Total GAD 7 Score 15 3  Anxiety Difficulty Not difficult at all -    PHQ-9 and GAD-7 numbers have increased. Will increase celexa to 40mg . Follow up as needed or if mood/anxiety worsens. Discussed regular exercise, mediatation, relaxation techniques. She does need labs. She will wait until COVID restrictions done.

## 2018-06-28 NOTE — Telephone Encounter (Signed)
PT requested refill on medication. Was denied from the pharmacy.  Citalopram.

## 2018-12-19 ENCOUNTER — Other Ambulatory Visit: Payer: Self-pay | Admitting: Physician Assistant

## 2018-12-19 DIAGNOSIS — F411 Generalized anxiety disorder: Secondary | ICD-10-CM

## 2019-03-06 DIAGNOSIS — Z20828 Contact with and (suspected) exposure to other viral communicable diseases: Secondary | ICD-10-CM | POA: Diagnosis not present

## 2019-03-14 ENCOUNTER — Other Ambulatory Visit: Payer: Self-pay | Admitting: Physician Assistant

## 2019-03-14 DIAGNOSIS — F411 Generalized anxiety disorder: Secondary | ICD-10-CM

## 2019-04-06 ENCOUNTER — Other Ambulatory Visit: Payer: Self-pay | Admitting: Physician Assistant

## 2019-04-06 DIAGNOSIS — F411 Generalized anxiety disorder: Secondary | ICD-10-CM

## 2019-07-07 ENCOUNTER — Other Ambulatory Visit: Payer: Self-pay | Admitting: Physician Assistant

## 2019-07-07 DIAGNOSIS — F411 Generalized anxiety disorder: Secondary | ICD-10-CM

## 2019-07-16 ENCOUNTER — Other Ambulatory Visit: Payer: Self-pay | Admitting: Physician Assistant

## 2019-07-16 DIAGNOSIS — F411 Generalized anxiety disorder: Secondary | ICD-10-CM

## 2019-07-17 ENCOUNTER — Ambulatory Visit (INDEPENDENT_AMBULATORY_CARE_PROVIDER_SITE_OTHER): Payer: BC Managed Care – PPO | Admitting: Physician Assistant

## 2019-07-17 ENCOUNTER — Other Ambulatory Visit: Payer: Self-pay

## 2019-07-17 VITALS — BP 117/67 | HR 78 | Ht 67.0 in | Wt 255.0 lb

## 2019-07-17 DIAGNOSIS — Z6837 Body mass index (BMI) 37.0-37.9, adult: Secondary | ICD-10-CM

## 2019-07-17 DIAGNOSIS — F411 Generalized anxiety disorder: Secondary | ICD-10-CM

## 2019-07-17 DIAGNOSIS — Z13220 Encounter for screening for lipoid disorders: Secondary | ICD-10-CM

## 2019-07-17 DIAGNOSIS — Z131 Encounter for screening for diabetes mellitus: Secondary | ICD-10-CM

## 2019-07-17 DIAGNOSIS — E6609 Other obesity due to excess calories: Secondary | ICD-10-CM | POA: Diagnosis not present

## 2019-07-17 DIAGNOSIS — Z79899 Other long term (current) drug therapy: Secondary | ICD-10-CM

## 2019-07-17 MED ORDER — NALTREXONE-BUPROPION HCL ER 8-90 MG PO TB12: 2.0000 | ORAL_TABLET | Freq: Two times a day (BID) | ORAL | 0 refills | Status: DC

## 2019-07-17 MED ORDER — CITALOPRAM HYDROBROMIDE 40 MG PO TABS: 40.0000 mg | ORAL_TABLET | Freq: Every day | ORAL | 1 refills | Status: DC

## 2019-07-17 MED ORDER — NALTREXONE-BUPROPION HCL ER 8-90 MG PO TB12: ORAL_TABLET | ORAL | 0 refills | Status: DC

## 2019-07-17 NOTE — Progress Notes (Signed)
Subjective:    Patient ID: Teresa Rojas, female    DOB: 10-01-1980, 39 y.o.   MRN: [id number]  HPI  Pt is a 39 yo female with GAD who presents to the clinic for refills. She would also like to discuss weight.   Her anxiety is controlled with celexa. No problems or concerns. During pandemic she has had good and bad days but mostly controlled. No need for adjustments.   She has gained another 15lbs since last visit. She would like to consider medication. She has tried different diets and trying to stay active. She would like some help.   She did get covid shot but does not have lot number.   .. Active Ambulatory Problems    Diagnosis Date Noted  . 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 No energy 03/06/2016  . GAD (generalized anxiety disorder) 03/06/2016  . Class 2 obesity due to excess calories without serious comorbidity with body mass index (BMI) of 37.0 to 37.9 in adult 07/23/2019   Resolved Ambulatory Problems    Diagnosis Date Noted  . Supervision of normal first pregnancy in first trimester 02/03/2013  . Rubella non-immune status, antepartum 02/07/2013  . Low-lying placenta 06/01/2013  . Supervision of normal first pregnancy 07/19/2013  . PROM (premature rupture of membranes) 09/10/2013  . IUD (intrauterine device) in place 10/17/2013  . Supervision of normal pregnancy, antepartum 06/03/2015  . Obesity in pregnancy, antepartum 06/03/2015  . Second trimester bleeding 07/02/2015  . Placenta previa antepartum 08/16/2015  . Choroid plexus cyst of fetus on prenatal ultrasound 08/16/2015  . Placenta previa antepartum in second trimester 09/20/2015  . Placenta previa with hemorrhage in second trimester 09/20/2015  . Antepartum placental abruption 10/23/2015  . Placental abruption in third trimester 12/08/2015  . Status post vaginal delivery 12/09/2015  . BMI 37.0-37.9, adult 12/04/2016   Past Medical History:  Diagnosis Date  . 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 Vision loss       Review of Systems See HPI.     Objective:   Physical Exam Vitals reviewed.  Constitutional:      Appearance: Normal appearance. She is obese.  HENT:     Head: Normocephalic.  Cardiovascular:     Rate and Rhythm: Normal rate and regular rhythm.     Pulses: Normal pulses.  Pulmonary:     Effort: Pulmonary effort is normal.     Breath sounds: Normal breath sounds.  Neurological:     General: No focal deficit present.     Mental Status: She is alert and oriented to person, place, and time.  Psychiatric:        Mood and Affect: Mood normal.      .. Depression screen [facility] 2/9 07/17/2019 06/28/2018 12/04/2016  Decreased Interest 0 3 0  Down, Depressed, Hopeless 1 0 0  PHQ - 2 Score 1 3 0  Altered sleeping 0 1 -  Tired, decreased energy 1 0 -  Change in appetite 1 3 -  Feeling bad or failure about yourself  0 0 -  Trouble concentrating 2 0 -  Moving slowly or fidgety/restless 0 0 -  Suicidal thoughts 0 0 -  PHQ-9 Score 5 7 -  Difficult doing work/chores Not difficult at all Not difficult at all -   .. GAD 7 : Generalized Anxiety Score 07/17/2019 06/28/2018 12/04/2016  Nervous, Anxious, on Edge 2 3 1   Control/stop worrying 1 3 1   Worry too much - different things 1  3 0  Trouble relaxing 0 1 0  Restless 1 3 0  Easily annoyed or irritable 1 1 1   Afraid - awful might happen 1 1 0  Total GAD 7 Score 7 15 3   Anxiety Difficulty Not difficult at all Not difficult at all -         Assessment & Plan:  Marland KitchenMarland KitchenTabithia was seen today for follow-up.  Diagnoses and all orders for this visit:  Class 2 obesity due to excess calories without serious comorbidity with body mass index (BMI) of 37.0 to 37.9 in adult -     TSH -     CBC with Differential/Platelet -     Naltrexone-buPROPion HCl ER 8-90 MG TB12; Take 1 tablet in am for 1 week, then increase to 1 tablet in am and 1 tablet in pm for 1 week, then increase to 2 tablets in am and 1 tablet in pm for 1 week, then increase to 2  tablets in am and 2 tablets in pm daily. -     Naltrexone-buPROPion HCl ER 8-90 MG TB12; Take 2 tablets by mouth in the morning and at bedtime.  GAD (generalized anxiety disorder) -     citalopram (CELEXA) 40 MG tablet; Take 1 tablet (40 mg total) by mouth daily. -     CBC with Differential/Platelet  Screening for diabetes mellitus -     COMPLETE METABOLIC PANEL WITH GFR  Screening for lipid disorders -     Lipid Panel w/reflex Direct LDL  Medication management -     CBC with Differential/Platelet  Fasting labs ordered and needed.   Marland Kitchen.Discussed low carb diet with 1500 calories and 80g of protein.  Exercising at least 150 minutes a week.  My Fitness Pal could be a Microbiologist.  Discussed options. Decided to try contrave.  Discussed side effects and how to titrate and then continue dosing.  Discussed IF 16 to 8.  Follow up in 2 months.   GAD and PHq were stable. Refilled celexa.   Spent 30 minutes with patient.

## 2019-07-17 NOTE — Patient Instructions (Signed)
Bupropion; Naltrexone extended-release tablets What is this medicine? BUPROPION; NALTREXONE (byoo PROE pee on; nal TREX one) is a combination of two drugs that help you lose weight. This product is used with a reduced calorie diet and exercise. This product can also help you maintain weight loss. This medicine may be used for other purposes; ask your health care provider or pharmacist if you have questions. COMMON BRAND NAME(S): Contrave What should I tell my health care provider before I take this medicine? They need to know if you have any of these conditions:  an eating disorder, such as anorexia or bulimia  diabetes  depression  glaucoma  head injury  heart disease  high blood pressure  history of drug abuse or alcohol abuse problem  history of a tumor or infection of your brain or spine  history of heart attack or stroke  history of irregular heartbeat  if you often drink alcohol  kidney disease  liver disease  low levels of sodium in the blood  mental illness  seizures  suicidal thoughts, plans, or attempt; a previous suicide attempt by you or a family member  taken an MAOI like Carbex, Eldepryl, Marplan, Nardil, or Parnate in last 14 days  an unusual or allergic reaction to bupropion, naltrexone, other medicines, foods, dyes, or preservatives  breast-feeding  pregnant or trying to become pregnant How should I use this medicine? Take this medicine by mouth with a glass of water. Follow the directions on the prescription label. Do not cut, crush or chew this medicine. Swallow the tablets whole. You can take it with or without food. Do not take with high-fat meals as this may increase your risk of seizures. Take your medicine at regular intervals. Do not take it more often than directed. Do not stop taking except on your doctor's advice. A special MedGuide will be given to you by the pharmacist with each prescription and refill. Be sure to read this  information carefully each time. Talk to your pediatrician regarding the use of this medicine in children. Special care may be needed. Overdosage: If you think you have taken too much of this medicine contact a poison control center or emergency room at once. NOTE: This medicine is only for you. Do not share this medicine with others. What if I miss a dose? If you miss a dose, skip it. Take your next dose at the normal time. Do not take extra or 2 doses at the same time to make up for the missed dose. What may interact with this medicine? Do not take this medicine with any of the following medications:  any medicines used to stop taking opioids such as methadone or buprenorphine  linezolid  MAOIs like Carbex, Eldepryl, Marplan, Nardil, and Parnate  methylene blue (injected into a vein)  often take narcotic medicines for pain or cough  other medicines that contain bupropion like Zyban or Wellbutrin This medicine may also interact with the following medications:  alcohol  certain medicines for blood pressure like metoprolol, propranolol  certain medicines for depression, anxiety, or psychotic disturbances  certain medicines for HIV or hepatitis  certain medicines for irregular heart beat like propafenone, flecainide  certain medicines for Parkinson's disease like amantadine, levodopa  certain medicines for seizures like carbamazepine, phenytoin, phenobarbital  certain medicines for sleep  cimetidine  clopidogrel  cyclophosphamide  digoxin  disulfiram  furazolidone  isoniazid  nicotine  orphenadrine  procarbazine  steroid medicines like prednisone or cortisone  stimulant medicines for attention disorders,   weight loss, or to stay awake  tamoxifen  theophylline  thiotepa  ticlopidine  tramadol  warfarin This list may not describe all possible interactions. Give your health care provider a list of all the medicines, herbs, non-prescription drugs, or  dietary supplements you use. Also tell them if you smoke, drink alcohol, or use illegal drugs. Some items may interact with your medicine. What should I watch for while using this medicine? Visit your doctor or healthcare provider for regular checks on your progress. This medicine may cause serious skin reactions. They can happen weeks to months after starting the medicine. Contact your healthcare provider right away if you notice fevers or flu-like symptoms with a rash. The rash may be red or purple and then turn into blisters or peeling of the skin. Or, you might notice a red rash with swelling of the face, lips or lymph nodes in your neck or under your arms. This medicine may affect blood sugar. Ask your healthcare provider if changes in diet or medicines are needed if you have diabetes. Patients and their families should watch out for new or worsening depression or thoughts of suicide. Also watch out for sudden changes in feelings such as feeling anxious, agitated, panicky, irritable, hostile, aggressive, impulsive, severely restless, overly excited and hyperactive, or not being able to sleep. If this happens, especially at the beginning of treatment or after a change in dose, call your healthcare provider. Avoid alcoholic drinks while taking this medicine. Drinking large amounts of alcoholic beverages, using sleeping or anxiety medicines, or quickly stopping the use of these agents while taking this medicine may increase your risk for a seizure. Do not drive or use heavy machinery until you know how this medicine affects you. This medicine can impair your ability to perform these tasks. Women should inform their health care provider if they wish to become pregnant or think they might be pregnant. Losing weight while pregnant is not advised and may cause harm to the unborn child. Talk to your health care provider for more information. What side effects may I notice from receiving this medicine? Side  effects that you should report to your doctor or health care professional as soon as possible:  allergic reactions like skin rash, itching or hives, swelling of the face, lips, or tongue  breathing problems  changes in vision  confusion  elevated mood, decreased need for sleep, racing thoughts, impulsive behavior  fast or irregular heartbeat  hallucinations, loss of contact with reality  increased blood pressure  rash, fever, and swollen lymph nodes  redness, blistering, peeling, or loosening of the skin, including inside the mouth  seizures  signs and symptoms of liver injury like dark yellow or brown urine; general ill feeling or flu-like symptoms; light-colored stools; loss of appetite; nausea; right upper belly pain; unusually weak or tired; yellowing of the eyes or skin  suicidal thoughts or other mood changes  vomiting Side effects that usually do not require medical attention (report to your doctor or health care professional if they continue or are bothersome):  constipation  headache  loss of appetite  indigestion, stomach upset  tremors This list may not describe all possible side effects. Call your doctor for medical advice about side effects. You may report side effects to FDA at 1-800-FDA-1088. Where should I keep my medicine? Keep out of the reach of children. Store at room temperature between 15 and 30 degrees C (59 and 86 degrees F). Throw away any unused medicine after the   expiration date. NOTE: This sheet is a summary. It may not cover all possible information. If you have questions about this medicine, talk to your doctor, pharmacist, or health care provider.  2020 Elsevier/Gold Standard (2019-01-20 16:26:28)  

## 2019-07-18 ENCOUNTER — Telehealth: Payer: Self-pay | Admitting: Physician Assistant

## 2019-07-18 DIAGNOSIS — Z79899 Other long term (current) drug therapy: Secondary | ICD-10-CM | POA: Diagnosis not present

## 2019-07-18 DIAGNOSIS — Z131 Encounter for screening for diabetes mellitus: Secondary | ICD-10-CM | POA: Diagnosis not present

## 2019-07-18 DIAGNOSIS — Z13220 Encounter for screening for lipoid disorders: Secondary | ICD-10-CM | POA: Diagnosis not present

## 2019-07-18 DIAGNOSIS — F411 Generalized anxiety disorder: Secondary | ICD-10-CM | POA: Diagnosis not present

## 2019-07-18 NOTE — Telephone Encounter (Signed)
Received fax for PA on Contrave sent through cover my meds waiting on determination - CF

## 2019-07-19 LAB — LIPID PANEL W/REFLEX DIRECT LDL
Cholesterol: 202 mg/dL — ABNORMAL HIGH (ref <200)
HDL: 61 mg/dL (ref >=50)
LDL Cholesterol (Calc): 127 mg/dL (calc) — ABNORMAL HIGH
Non-HDL Cholesterol (Calc): 141 mg/dL (calc) — ABNORMAL HIGH (ref <130)
Total CHOL/HDL Ratio: 3.3 (calc) (ref <5.0)
Triglycerides: 58 mg/dL (ref <150)

## 2019-07-19 LAB — COMPLETE METABOLIC PANEL WITH GFR
AG Ratio: 1.5 (calc) (ref 1.0–2.5)
ALT: 9 U/L (ref 6–29)
AST: 11 U/L (ref 10–30)
Albumin: 4.2 g/dL (ref 3.6–5.1)
Alkaline phosphatase (APISO): 66 U/L (ref 31–125)
BUN: 14 mg/dL (ref 7–25)
CO2: 30 mmol/L (ref 20–32)
Calcium: 9.4 mg/dL (ref 8.6–10.2)
Chloride: 103 mmol/L (ref 98–110)
Creat: 0.66 mg/dL (ref 0.50–1.10)
GFR, Est African American: 131 mL/min/{1.73_m2} (ref >=60)
GFR, Est Non African American: 113 mL/min/{1.73_m2} (ref >=60)
Globulin: 2.8 g/dL (calc) (ref 1.9–3.7)
Glucose, Bld: 81 mg/dL (ref 65–99)
Potassium: 4.6 mmol/L (ref 3.5–5.3)
Sodium: 138 mmol/L (ref 135–146)
Total Bilirubin: 0.4 mg/dL (ref 0.2–1.2)
Total Protein: 7 g/dL (ref 6.1–8.1)

## 2019-07-19 LAB — CBC WITH DIFFERENTIAL/PLATELET
Absolute Monocytes: 451 cells/uL (ref 200–950)
Basophils Absolute: 42 cells/uL (ref 0–200)
Basophils Relative: 0.8 %
Eosinophils Absolute: 133 cells/uL (ref 15–500)
Eosinophils Relative: 2.5 %
HCT: 38.7 % (ref 35.0–45.0)
Hemoglobin: 12.7 g/dL (ref 11.7–15.5)
Lymphs Abs: 1601 cells/uL (ref 850–3900)
MCH: 29.3 pg (ref 27.0–33.0)
MCHC: 32.8 g/dL (ref 32.0–36.0)
MCV: 89.2 fL (ref 80.0–100.0)
MPV: 9.4 fL (ref 7.5–12.5)
Monocytes Relative: 8.5 %
Neutro Abs: 3074 cells/uL (ref 1500–7800)
Neutrophils Relative %: 58 %
Platelets: 286 10*3/uL (ref 140–400)
RBC: 4.34 10*6/uL (ref 3.80–5.10)
RDW: 12.2 % (ref 11.0–15.0)
Total Lymphocyte: 30.2 %
WBC: 5.3 10*3/uL (ref 3.8–10.8)

## 2019-07-19 LAB — TSH: TSH: 1.3 mIU/L

## 2019-07-19 NOTE — Telephone Encounter (Signed)
Patient left vm checking on this, if you could follow up with her when approved. Thanks.

## 2019-07-19 NOTE — Progress Notes (Signed)
Teresa Rojas,   Thyroid perfect. Kidney, liver, glucose look great. Hemoglobin normal. HDL cholesterol GREAT. LdL not optimal but to goal for age and risk factors. Continue to work on overall healthy diet and exercise.

## 2019-07-20 ENCOUNTER — Encounter: Payer: Self-pay | Admitting: Physician Assistant

## 2019-07-20 NOTE — Telephone Encounter (Signed)
Received fax from [facility] they approved Contrave. I tried to call patient but voicemail was full and I could not leave message.   Reference: [id number] Valid: 07/18/19 - 01/13/20. - CF

## 2019-07-23 ENCOUNTER — Encounter: Payer: Self-pay | Admitting: Physician Assistant

## 2019-07-23 DIAGNOSIS — E6609 Other obesity due to excess calories: Secondary | ICD-10-CM | POA: Insufficient documentation

## 2019-07-23 DIAGNOSIS — E66812 Obesity, class 2: Secondary | ICD-10-CM | POA: Insufficient documentation

## 2019-08-14 ENCOUNTER — Other Ambulatory Visit: Payer: Self-pay | Admitting: Family Medicine

## 2019-08-14 NOTE — Telephone Encounter (Signed)
Patient is taking, can you sign please?

## 2019-08-14 NOTE — Telephone Encounter (Signed)
Did she start? If need refill ok to refill until 6/22 appt.

## 2019-09-19 ENCOUNTER — Ambulatory Visit (INDEPENDENT_AMBULATORY_CARE_PROVIDER_SITE_OTHER): Payer: BC Managed Care – PPO | Admitting: Physician Assistant

## 2019-09-19 ENCOUNTER — Other Ambulatory Visit: Payer: Self-pay

## 2019-09-19 ENCOUNTER — Encounter: Payer: Self-pay | Admitting: Physician Assistant

## 2019-09-19 DIAGNOSIS — Z6838 Body mass index (BMI) 38.0-38.9, adult: Secondary | ICD-10-CM | POA: Diagnosis not present

## 2019-09-19 DIAGNOSIS — E6609 Other obesity due to excess calories: Secondary | ICD-10-CM

## 2019-09-19 MED ORDER — CONTRAVE 8-90 MG PO TB12: 2.0000 | ORAL_TABLET | Freq: Two times a day (BID) | ORAL | 2 refills | Status: DC

## 2019-09-19 NOTE — Progress Notes (Signed)
° °  Subjective:    Patient ID: Teresa Rojas, female    DOB: 1980-12-05, 39 y.o.   MRN: [id number]  HPI  Pt is a 39 yo obese female who presents to the clinic to follow up on contrave for weight loss.   In the last 2 months lost 8lbs with contrave diet and exercise. BMI 38. Pt is walking most morning. No side effects except maybe some constipation. She is not counting her calories.   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   No energy 03/06/2016   GAD (generalized anxiety disorder) 03/06/2016   Class 2 obesity due to excess calories without serious comorbidity with body mass index (BMI) of 38.0 to 38.9 in adult 07/23/2019   Resolved Ambulatory Problems    Diagnosis Date Noted   Supervision of normal first pregnancy in first trimester 02/03/2013   Rubella non-immune status, antepartum 02/07/2013   Low-lying placenta 06/01/2013   Supervision of normal first pregnancy 07/19/2013   PROM (premature rupture of membranes) 09/10/2013   IUD (intrauterine device) in place 10/17/2013   Supervision of normal pregnancy, antepartum 06/03/2015   Obesity in pregnancy, antepartum 06/03/2015   Second trimester bleeding 07/02/2015   Placenta previa antepartum 08/16/2015   Choroid plexus cyst of fetus on prenatal ultrasound 08/16/2015   Placenta previa antepartum in second trimester 09/20/2015   Placenta previa with hemorrhage in second trimester 09/20/2015   Antepartum placental abruption 10/23/2015   Placental abruption in third trimester 12/08/2015   Status post vaginal delivery 12/09/2015   BMI 37.0-37.9, adult 12/04/2016   Past Medical History:  Diagnosis Date   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     Objective:   Physical Exam Vitals reviewed.  Constitutional:      Appearance: Normal appearance. She is obese.  Cardiovascular:     Rate and Rhythm: Normal rate and regular  rhythm.     Pulses: Normal pulses.     Heart sounds: Normal heart sounds.  Pulmonary:     Effort: Pulmonary effort is normal.     Breath sounds: Normal breath sounds.  Neurological:     General: No focal deficit present.     Mental Status: She is alert and oriented to person, place, and time.  Psychiatric:        Mood and Affect: Mood normal.           Assessment & Plan:  Marland KitchenMarland KitchenLuciann was seen today for follow-up.  Diagnoses and all orders for this visit:  Class 2 obesity due to excess calories without serious comorbidity with body mass index (BMI) of 38.0 to 38.9 in adult -     Naltrexone-buPROPion HCl ER (CONTRAVE) 8-90 MG TB12; Take 2 tablets by mouth in the morning and at bedtime.   Continue with contrave. Refills sent for 3 months.  Discussed portion sizes, increase exercise at least 150 minutes a week. Goal weight loss in 3 months is 10 pounds.

## 2019-09-26 ENCOUNTER — Encounter: Payer: BC Managed Care – PPO | Admitting: Advanced Practice Midwife

## 2019-10-03 ENCOUNTER — Ambulatory Visit (INDEPENDENT_AMBULATORY_CARE_PROVIDER_SITE_OTHER): Payer: BC Managed Care – PPO | Admitting: Advanced Practice Midwife

## 2019-10-03 ENCOUNTER — Encounter: Payer: Self-pay | Admitting: Advanced Practice Midwife

## 2019-10-03 ENCOUNTER — Other Ambulatory Visit: Payer: Self-pay

## 2019-10-03 ENCOUNTER — Other Ambulatory Visit (HOSPITAL_COMMUNITY)
Admission: RE | Admit: 2019-10-03 | Discharge: 2019-10-03 | Disposition: A | Discharge status: Home / Self Care | Payer: BC Managed Care – PPO | Source: Ambulatory Visit | Attending: Advanced Practice Midwife | Admitting: Advanced Practice Midwife

## 2019-10-03 VITALS — BP 132/84 | HR 86 | Resp 16 | Ht 67.0 in | Wt 244.0 lb

## 2019-10-03 DIAGNOSIS — N898 Other specified noninflammatory disorders of vagina: Secondary | ICD-10-CM | POA: Diagnosis not present

## 2019-10-03 DIAGNOSIS — Z124 Encounter for screening for malignant neoplasm of cervix: Secondary | ICD-10-CM | POA: Insufficient documentation

## 2019-10-03 DIAGNOSIS — Z01419 Encounter for gynecological examination (general) (routine) without abnormal findings: Secondary | ICD-10-CM | POA: Diagnosis not present

## 2019-10-03 NOTE — Progress Notes (Signed)
Subjective:     Teresa Rojas is a 39 y.o. female here at [facility] for a routine exam.  Current complaints: increased vaginal discharge since her last baby was born 4 years ago.    Gynecologic History No LMP recorded. (Menstrual status: IUD). Contraception: IUD Last Pap: 2017. Results were: normal Last mammogram: n/a.   Obstetric History OB History  Gravida Para Term Preterm AB Living  2 2 2  0 0 2  SAB TAB Ectopic Multiple Live Births  0 0 0 0 2    # Outcome Date GA Lbr Len/2nd Weight Sex Delivery Anes PTL Lv  2 Term 12/08/15 [redacted]w[redacted]d   M Vag-Spont     1 Term 09/10/13 [redacted]w[redacted]d 06:25 / 00:51 7 lb 2.6 oz (3.25 kg) F Vag-Spont EPI  LIV     The following portions of the patient's history were reviewed and updated as appropriate: allergies, current medications, past family history, past medical history, past social history, past surgical history and problem list.  Review of Systems Pertinent items noted in HPI and remainder of comprehensive ROS otherwise negative.    Objective:   BP 132/84   Pulse 86   Resp 16   Ht 5\' 7"  (1.702 m)   Wt 244 lb (110.7 kg)   Breastfeeding No   BMI 38.22 kg/m  VS reviewed, nursing note reviewed,  Constitutional: well developed, well nourished, no distress HEENT: normocephalic CV: normal rate Pulm/chest wall: normal effort Breast Exam:  right breast normal without mass, skin or nipple changes or axillary nodes, left breast normal without mass, skin or nipple changes or axillary nodes Abdomen: soft Neuro: alert and oriented x 3 Skin: warm, dry Psych: affect normal Pelvic exam: Cervix pink, visually closed, without lesion, IUD strings visualized ~ 3 cm in length from cervical os, scant white creamy discharge, vaginal walls and external genitalia normal Bimanual exam: Cervix 0/long/high, firm, anterior, neg CMT, uterus nontender, nonenlarged, adnexa without tenderness, enlargement, or mass     Assessment/Plan:   1. Screening for  cervical cancer  - Cytology - PAP( [place]) - Cervicovaginal ancillary only( [place])  2. Vaginal discharge --No s/sx of infection, swab collected today --May be increased discharge related to IUD, which was placed after her son was born --Pt is happy with IUD and desires to keep IUD even it is the cause of increased discharge - Cervicovaginal ancillary only( [place])  3. Well woman exam with routine gynecological exam    Follow up in: 1 year or as needed.   [redacted]w[redacted]d, CNM 1:38 PM

## 2019-10-04 LAB — CERVICOVAGINAL ANCILLARY ONLY
Bacterial Vaginitis (gardnerella): NEGATIVE
Candida Glabrata: NEGATIVE
Candida Vaginitis: POSITIVE — AB
Comment: NEGATIVE
Comment: NEGATIVE
Comment: NEGATIVE

## 2019-10-04 LAB — CYTOLOGY - PAP
Comment: NEGATIVE
Diagnosis: NEGATIVE
High risk HPV: NEGATIVE

## 2019-10-10 ENCOUNTER — Other Ambulatory Visit: Payer: Self-pay

## 2019-10-10 DIAGNOSIS — B379 Candidiasis, unspecified: Secondary | ICD-10-CM

## 2019-10-10 MED ORDER — FLUCONAZOLE 150 MG PO TABS: 150.0000 mg | ORAL_TABLET | Freq: Once | ORAL | 0 refills | Status: AC

## 2019-10-10 NOTE — Progress Notes (Signed)
Pt made aware via MyChart of positive yeast. Diflucan sent to pharmacy per [facility], CNM

## 2019-10-13 MED ORDER — TERCONAZOLE 0.8 % VA CREA: 1.0000 | TOPICAL_CREAM | Freq: Every day | VAGINAL | 0 refills | Status: DC

## 2019-10-13 NOTE — Telephone Encounter (Signed)
Pt sent MyChart message stating that the pharmacist told her the Diflucan we sent for the yeast infection could interact with other medicine she is taking. Terazol 3 sent to pt's pharmacy. Pt is aware.

## 2019-12-10 ENCOUNTER — Other Ambulatory Visit: Payer: Self-pay | Admitting: Family Medicine

## 2019-12-10 DIAGNOSIS — Z6838 Body mass index (BMI) 38.0-38.9, adult: Secondary | ICD-10-CM

## 2019-12-10 DIAGNOSIS — E6609 Other obesity due to excess calories: Secondary | ICD-10-CM

## 2019-12-20 ENCOUNTER — Other Ambulatory Visit: Payer: Self-pay

## 2019-12-20 ENCOUNTER — Ambulatory Visit (INDEPENDENT_AMBULATORY_CARE_PROVIDER_SITE_OTHER): Payer: BC Managed Care – PPO | Admitting: Physician Assistant

## 2019-12-20 ENCOUNTER — Encounter: Payer: Self-pay | Admitting: Physician Assistant

## 2019-12-20 VITALS — BP 123/72 | HR 77 | Ht 67.0 in | Wt 234.0 lb

## 2019-12-20 DIAGNOSIS — Z6838 Body mass index (BMI) 38.0-38.9, adult: Secondary | ICD-10-CM

## 2019-12-20 DIAGNOSIS — Z23 Encounter for immunization: Secondary | ICD-10-CM | POA: Diagnosis not present

## 2019-12-20 DIAGNOSIS — E6609 Other obesity due to excess calories: Secondary | ICD-10-CM

## 2019-12-20 DIAGNOSIS — F411 Generalized anxiety disorder: Secondary | ICD-10-CM

## 2019-12-20 DIAGNOSIS — K5900 Constipation, unspecified: Secondary | ICD-10-CM

## 2019-12-20 MED ORDER — CONTRAVE 8-90 MG PO TB12: 2.0000 | ORAL_TABLET | Freq: Two times a day (BID) | ORAL | 5 refills | Status: DC

## 2019-12-20 MED ORDER — CITALOPRAM HYDROBROMIDE 40 MG PO TABS: 40.0000 mg | ORAL_TABLET | Freq: Every day | ORAL | 1 refills | Status: DC

## 2019-12-20 NOTE — Progress Notes (Signed)
Subjective:    Patient ID: Teresa Rojas, female    DOB: [birth year], 39 y.o.   MRN: [id number]  HPI 39 year old female presenting for weight check follow up  Teresa Rojas states that Teresa Rojas is experiencing side effects- constipation,uses a  stool softeners but does not see much improvement. 5-7 days not going, then going all at once. Teresa Rojas also is struggling at the end of the day with controlling diet. Asked about if its ok to take Contrave closer together time wise. Informed its ok to take first thing in the morning and again in the afternoon.  Patient is also starting to run to exercise each week.    Teresa Rojas does state that Teresa Rojas has been more iritable lately at work. Co-workers have told her Teresa Rojas is quicker to anger, and irritable since being on Contrave.  Teresa Rojas has lost 10lbs in 3 months and does think it is motivating her to do better.   .. Active Ambulatory Problems    Diagnosis Date Noted  . 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 No energy 03/06/2016  . GAD (generalized anxiety disorder) 03/06/2016  . Class 2 obesity due to excess calories without serious comorbidity with body mass index (BMI) of 38.0 to 38.9 in adult 07/23/2019   Resolved Ambulatory Problems    Diagnosis Date Noted  . Supervision of normal first pregnancy in first trimester 02/03/2013  . Rubella non-immune status, antepartum 02/07/2013  . Low-lying placenta 06/01/2013  . Supervision of normal first pregnancy 07/19/2013  . PROM (premature rupture of membranes) 09/10/2013  . IUD (intrauterine device) in place 10/17/2013  . Supervision of normal pregnancy, antepartum 06/03/2015  . Obesity in pregnancy, antepartum 06/03/2015  . Second trimester bleeding 07/02/2015  . Placenta previa antepartum 08/16/2015  . Choroid plexus cyst of fetus on prenatal ultrasound 08/16/2015  . Placenta previa antepartum in second trimester 09/20/2015  . Placenta previa with hemorrhage in second trimester 09/20/2015  . Antepartum placental abruption  10/23/2015  . Placental abruption in third trimester 12/08/2015  . Status post vaginal delivery 12/09/2015  . BMI 37.0-37.9, adult 12/04/2016   Past Medical History:  Diagnosis Date  . 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 Vision loss      Review of Systems  Constitutional: Negative for chills and fever.  Respiratory: Negative for chest tightness and shortness of breath.   Cardiovascular: Negative for chest pain and palpitations.  Gastrointestinal: Positive for constipation. Negative for blood in stool and diarrhea.       Objective:   Physical Exam Constitutional:      Appearance: Normal appearance. Teresa Rojas is obese.  Cardiovascular:     Rate and Rhythm: Normal rate and regular rhythm.     Pulses: Normal pulses.     Heart sounds: Normal heart sounds.  Pulmonary:     Effort: Pulmonary effort is normal.     Breath sounds: Normal breath sounds.  Neurological:     Mental Status: Teresa Rojas is alert.     .. Depression screen [facility] Rojas 2/9 12/20/2019 07/17/2019 06/28/2018 12/04/2016  Decreased Interest 0 0 3 0  Down, Depressed, Hopeless 0 1 0 0  PHQ - 2 Score 0 1 3 0  Altered sleeping 0 0 1 -  Tired, decreased energy 2 1 0 -  Change in appetite 1 1 3  -  Feeling bad or failure about yourself  2 0 0 -  Trouble concentrating 1 2 0 -  Moving slowly or fidgety/restless 1 0 0 -  Suicidal thoughts 0 0 0 -  PHQ-9 Score 7 5 7  -  Difficult doing work/chores Not difficult at all Not difficult at all Not difficult at all -   .. GAD 7 : Generalized Anxiety Score 12/20/2019 07/17/2019 06/28/2018 12/04/2016  Nervous, Anxious, on Edge 2 2 3 1   Control/stop worrying 1 1 3 1   Worry too much - different things 2 1 3  0  Trouble relaxing 2 0 1 0  Restless 2 1 3  0  Easily annoyed or irritable 2 1 1 1   Afraid - awful might happen 0 1 1 0  Total GAD 7 Score 11 7 15 3   Anxiety Difficulty Somewhat difficult Not difficult at all Not difficult at all -         Assessment & Plan:  39 year old obese female  here for a weight check with new onset irritability and constipation  . Alisyn was seen today for weight check.  Diagnoses and all orders for this visit:  Class 2 obesity due to excess calories without serious comorbidity with body mass index (BMI) of 38.0 to 38.9 in adult -     Naltrexone-buPROPion HCl ER (CONTRAVE) 8-90 MG TB12; Take 2 tablets by mouth in the morning and at bedtime.  Flu vaccine need -     Flu Vaccine QUAD 6+ mos PF IM (Fluarix Quad PF)  GAD (generalized anxiety disorder) -     citalopram (CELEXA) 40 MG tablet; Take 1 tablet (40 mg total) by mouth daily.     -obesity Continue contrave. Teresa Rojas can take in morning and mid afternoon to help get her through dinner.   -Irritability Discussed stress control and how to deal with situations that act as a trigger including potentially changing times when Teresa Rojas takes the Contrave or lowering the dose   -Constipation Discussed increasing the amount of fiber in her diet through foods like apples and leafy greeens and use of fiber supplements like metamucil. Patient is also taking OTC stool softeners to aid in constipation. Teresa Rojas is hesitant to use laxatives until trying this for some time. Teresa Rojas will use my chart to let know if symptoms worsen or do not improve.   Follow up 3 months for additional weight check. Pt knows to call if symptoms worsen or Teresa Rojas can no longer tolerate.   PA-C, have reviewed and agree with the above documentation in it's entirety.

## 2020-01-11 ENCOUNTER — Other Ambulatory Visit: Payer: Self-pay | Admitting: Physician Assistant

## 2020-01-11 DIAGNOSIS — Z6838 Body mass index (BMI) 38.0-38.9, adult: Secondary | ICD-10-CM

## 2020-01-11 DIAGNOSIS — E6609 Other obesity due to excess calories: Secondary | ICD-10-CM

## 2020-01-26 ENCOUNTER — Encounter: Payer: Self-pay | Admitting: Physician Assistant

## 2020-02-01 ENCOUNTER — Telehealth: Payer: Self-pay | Admitting: *Deleted

## 2020-02-01 NOTE — Telephone Encounter (Signed)
PA sent to Cover My Meds continue Contrave. Awaiting decision.

## 2020-02-05 NOTE — Telephone Encounter (Signed)
Contrave approved with [employer] for contrave.  Authorization effective from 02/01/20-01/30/21. Pharmacy notified.

## 2020-02-11 DIAGNOSIS — A09 Infectious gastroenteritis and colitis, unspecified: Secondary | ICD-10-CM | POA: Diagnosis not present

## 2020-02-11 DIAGNOSIS — R197 Diarrhea, unspecified: Secondary | ICD-10-CM | POA: Diagnosis not present

## 2020-02-26 DIAGNOSIS — L814 Other melanin hyperpigmentation: Secondary | ICD-10-CM | POA: Diagnosis not present

## 2020-02-26 DIAGNOSIS — L720 Epidermal cyst: Secondary | ICD-10-CM | POA: Diagnosis not present

## 2020-02-26 DIAGNOSIS — X32XXXS Exposure to sunlight, sequela: Secondary | ICD-10-CM | POA: Diagnosis not present

## 2020-02-26 DIAGNOSIS — D1801 Hemangioma of skin and subcutaneous tissue: Secondary | ICD-10-CM | POA: Diagnosis not present

## 2020-06-17 ENCOUNTER — Encounter: Payer: Self-pay | Admitting: Physician Assistant

## 2020-06-17 ENCOUNTER — Other Ambulatory Visit: Payer: Self-pay

## 2020-06-17 ENCOUNTER — Ambulatory Visit: Payer: BC Managed Care – PPO | Admitting: Physician Assistant

## 2020-06-17 DIAGNOSIS — Z6834 Body mass index (BMI) 34.0-34.9, adult: Secondary | ICD-10-CM | POA: Diagnosis not present

## 2020-06-17 DIAGNOSIS — E6609 Other obesity due to excess calories: Secondary | ICD-10-CM

## 2020-06-17 MED ORDER — CONTRAVE 8-90 MG PO TB12: 2.0000 | ORAL_TABLET | Freq: Two times a day (BID) | ORAL | 5 refills | Status: DC

## 2020-06-17 NOTE — Progress Notes (Signed)
   Subjective:    Patient ID: Teresa Rojas, female    DOB: [birth year], 40 y.o.   MRN: [id number]  HPI  Pt is a 40 yo obese female who presents to the clinic for weight loss.   She is on contrave and doing well. She has lost another 15lbs in 6 months. She is not exercising like she should. She does feel like it helps her make good decisions. No known side effects.   .. Active Ambulatory Problems    Diagnosis Date Noted  . 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 No energy 03/06/2016  . GAD (generalized anxiety disorder) 03/06/2016  . Class 2 obesity due to excess calories without serious comorbidity with body mass index (BMI) of 38.0 to 38.9 in adult 07/23/2019   Resolved Ambulatory Problems    Diagnosis Date Noted  . Supervision of normal first pregnancy in first trimester 02/03/2013  . Rubella non-immune status, antepartum 02/07/2013  . Low-lying placenta 06/01/2013  . Supervision of normal first pregnancy 07/19/2013  . PROM (premature rupture of membranes) 09/10/2013  . IUD (intrauterine device) in place 10/17/2013  . Supervision of normal pregnancy, antepartum 06/03/2015  . Obesity in pregnancy, antepartum 06/03/2015  . Second trimester bleeding 07/02/2015  . Placenta previa antepartum 08/16/2015  . Choroid plexus cyst of fetus on prenatal ultrasound 08/16/2015  . Placenta previa antepartum in second trimester 09/20/2015  . Placenta previa with hemorrhage in second trimester 09/20/2015  . Antepartum placental abruption 10/23/2015  . Placental abruption in third trimester 12/08/2015  . Status post vaginal delivery 12/09/2015  . BMI 37.0-37.9, adult 12/04/2016   Past Medical History:  Diagnosis Date  . 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 Vision loss        Review of Systems  All other systems reviewed and are negative.      Objective:   Physical Exam Vitals reviewed.  Constitutional:      Appearance: Normal appearance. She is obese.  Cardiovascular:     Rate and  Rhythm: Normal rate and regular rhythm.     Pulses: Normal pulses.     Heart sounds: Normal heart sounds.  Pulmonary:     Effort: Pulmonary effort is normal.     Breath sounds: Normal breath sounds.  Neurological:     General: No focal deficit present.     Mental Status: She is alert and oriented to person, place, and time.  Psychiatric:        Mood and Affect: Mood normal.           Assessment & Plan:  Marland KitchenMarland KitchenSherilynn was seen today for follow-up.  Diagnoses and all orders for this visit:  Class 1 obesity due to excess calories without serious comorbidity with body mass index (BMI) of 34.0 to 34.9 in adult -     Naltrexone-buPROPion HCl ER (CONTRAVE) 8-90 MG TB12; Take 2 tablets by mouth in the morning and at bedtime.   Doing great.  Continue working on regular exercise.  Continue fine tunning diet and portion sizes.  Follow up in 6 months.

## 2020-06-18 ENCOUNTER — Ambulatory Visit: Payer: BC Managed Care – PPO | Admitting: Physician Assistant

## 2020-06-27 ENCOUNTER — Other Ambulatory Visit: Payer: Self-pay | Admitting: Physician Assistant

## 2020-06-27 DIAGNOSIS — F411 Generalized anxiety disorder: Secondary | ICD-10-CM

## 2020-07-31 ENCOUNTER — Encounter: Payer: Self-pay | Admitting: Physician Assistant

## 2020-07-31 DIAGNOSIS — D485 Neoplasm of uncertain behavior of skin: Secondary | ICD-10-CM | POA: Diagnosis not present

## 2020-07-31 DIAGNOSIS — L820 Inflamed seborrheic keratosis: Secondary | ICD-10-CM | POA: Diagnosis not present

## 2020-07-31 DIAGNOSIS — L720 Epidermal cyst: Secondary | ICD-10-CM | POA: Diagnosis not present

## 2020-07-31 DIAGNOSIS — L814 Other melanin hyperpigmentation: Secondary | ICD-10-CM | POA: Diagnosis not present

## 2020-08-05 ENCOUNTER — Encounter: Payer: Self-pay | Admitting: Physician Assistant

## 2020-08-05 DIAGNOSIS — Z800 Family history of malignant neoplasm of digestive organs: Secondary | ICD-10-CM | POA: Insufficient documentation

## 2020-10-08 ENCOUNTER — Encounter: Payer: Self-pay | Admitting: Advanced Practice Midwife

## 2020-10-08 ENCOUNTER — Other Ambulatory Visit: Payer: Self-pay

## 2020-10-08 ENCOUNTER — Other Ambulatory Visit (HOSPITAL_COMMUNITY)
Admission: RE | Admit: 2020-10-08 | Discharge: 2020-10-08 | Disposition: A | Discharge status: Home / Self Care | Payer: BC Managed Care – PPO | Source: Ambulatory Visit | Attending: Advanced Practice Midwife | Admitting: Advanced Practice Midwife

## 2020-10-08 ENCOUNTER — Ambulatory Visit (INDEPENDENT_AMBULATORY_CARE_PROVIDER_SITE_OTHER): Payer: BC Managed Care – PPO | Admitting: Advanced Practice Midwife

## 2020-10-08 VITALS — BP 136/79 | HR 73 | Resp 16 | Ht 67.0 in | Wt 217.0 lb

## 2020-10-08 DIAGNOSIS — Z30433 Encounter for removal and reinsertion of intrauterine contraceptive device: Secondary | ICD-10-CM

## 2020-10-08 DIAGNOSIS — Z01419 Encounter for gynecological examination (general) (routine) without abnormal findings: Secondary | ICD-10-CM

## 2020-10-08 DIAGNOSIS — Z124 Encounter for screening for malignant neoplasm of cervix: Secondary | ICD-10-CM

## 2020-10-08 MED ORDER — LEVONORGESTREL 20 MCG/DAY IU IUD
1.0000 | INTRAUTERINE_SYSTEM | Freq: Once | INTRAUTERINE | Status: AC
  Administered 2020-10-08: 1 via INTRAUTERINE

## 2020-10-08 MED ORDER — LEVONORGESTREL 20 MCG/DAY IU IUD: 1.0000 | INTRAUTERINE_SYSTEM | Freq: Once | INTRAUTERINE | Status: DC

## 2020-10-08 NOTE — Progress Notes (Signed)
Subjective:     Teresa Rojas is a 40 y.o. female here at [facility] for a routine exam and IUD replacement.  Current complaints: None Mirena IUD placed postpartum in 2017, last Pap 2021 with normal results.  Personal health questionnaire reviewed: yes.  Do you have a primary care provider? Yes, Tandy Gaw, PA Do you feel safe at home? yes  Flowsheet Row Office Visit from 12/20/2019 in [facility]  PHQ-2 Total Score 0       Health Maintenance Due  Topic Date Due   Pneumococcal Vaccine 39-69 Years old (1 - PCV) Never done   Hepatitis C Screening  Never done   COVID-19 Vaccine (4 - Booster) 06/17/2020     Risk factors for chronic health problems: Smoking: Alchohol/how much: Pt BMI: Body mass index is 33.99 kg/m.   Gynecologic History No LMP recorded. (Menstrual status: IUD). Contraception: IUD Last Pap: 2021. Results were: normal Last mammogram: n/a.   Obstetric History OB History  Gravida Para Term Preterm AB Living  2 2 2  0 0 2  SAB IAB Ectopic Multiple Live Births  0 0 0 0 2    # Outcome Date GA Lbr Len/2nd Weight Sex Delivery Anes PTL Lv  2 Term 12/08/15 [redacted]w[redacted]d   M Vag-Spont     1 Term 09/10/13 [redacted]w[redacted]d 06:25 / 00:51 7 lb 2.6 oz (3.25 kg) F Vag-Spont EPI  LIV     The following portions of the patient's history were reviewed and updated as appropriate: allergies, current medications, past family history, past medical history, past social history, past surgical history, and problem list.  Review of Systems Pertinent items noted in HPI and remainder of comprehensive ROS otherwise negative.    Objective:   BP 136/79   Pulse 73   Resp 16   Ht 5\' 7"  (1.702 m)   Wt 217 lb (98.4 kg)   BMI 33.99 kg/m  VS reviewed, nursing note reviewed,  Constitutional: well developed, well nourished, no distress HEENT: normocephalic CV: normal rate Pulm/chest wall: normal effort Breast Exam:   performed: right breast normal  without abnormal mass, skin or nipple changes or axillary nodes, left breast normal without abnormal mass, skin or nipple changes or axillary nodes. Smooth mobile, ropelike masses bilaterally, c/w benign fibrocystic breasts.  Abdomen: soft Neuro: alert and oriented x 3 Skin: warm, dry Psych: affect normal Pelvic exam: Performed: Cervix pink, visually closed, without lesion, scant white creamy discharge, vaginal walls and external genitalia normal Bimanual exam: Cervix 0/long/high, firm, anterior, neg CMT, uterus nontender, nonenlarged, adnexa without tenderness, enlargement, or mass     IUD Removal  Patient identified, informed consent performed.  Patient was in the dorsal lithotomy position, normal external genitalia was noted.  A speculum was placed in the patient's vagina, normal discharge was noted, no lesions. The multiparous cervix was visualized, no lesions, no abnormal discharge.  The strings of the IUD were grasped and pulled using kelly forceps. The IUD was removed in its entirety. Patient tolerated the procedure well.      IUD Procedure Note Discussed risks of irregular bleeding, cramping, infection, malpositioning or misplacement of the IUD outside the uterus which may require further procedures. Time out was performed.  Urine pregnancy test negative.  Speculum placed in the vagina.  Cervix visualized.  Cleaned with Betadine x 2.  Grasped anteriorly with a single tooth tenaculum.  Uterus sounded to 7 cm.  Mirena IUD placed per manufacturer's recommendations. Some difficulty  in inserting Mirena through internal os of cervix so plastic dilator was used then IUD inserted without difficulty. Strings trimmed to 3 cm. Tenaculum was removed, good hemostasis noted.  Patient tolerated procedure well.   Patient was given post-procedure instructions and the Mirena care card with expiration date.  Patient was also asked to check IUD strings periodically and follow up in 4-6 weeks for IUD check.      Assessment/Plan:    1. Encounter for IUD removal and reinsertion  - levonorgestrel (MIRENA) 20 MCG/DAY IUD 1 each --See procedure note.  String check in 4 weeks.  2. Well woman exam with routine gynecological exam --Doing well, no gyn concerns or complaints --Pap 1  year ago, per guidelines pap in 2-4 years.  Mirena IUD at 5 years this year.  IUD approved for 7 years but menses may return with lower hormones. --Discussed options with pt who elects to have IUD replaced and repeat Pap today, then both will be due in 5 years. --Given family hx, mammogram next year at age 56 then annually  3. Encounter for screening for cervical cancer --Pap collected today     Follow up in: 1  year  or as needed.   Sharen Counter, CNM 11:16 AM

## 2020-10-15 LAB — CYTOLOGY - PAP
Adequacy: ABSENT
Comment: NEGATIVE
Diagnosis: NEGATIVE
High risk HPV: NEGATIVE

## 2020-11-05 ENCOUNTER — Ambulatory Visit (INDEPENDENT_AMBULATORY_CARE_PROVIDER_SITE_OTHER): Payer: BC Managed Care – PPO | Admitting: Advanced Practice Midwife

## 2020-11-05 ENCOUNTER — Encounter: Payer: Self-pay | Admitting: Advanced Practice Midwife

## 2020-11-05 ENCOUNTER — Other Ambulatory Visit: Payer: Self-pay

## 2020-11-05 VITALS — BP 116/66 | HR 80 | Resp 16 | Ht 67.0 in | Wt 217.0 lb

## 2020-11-05 DIAGNOSIS — Z975 Presence of (intrauterine) contraceptive device: Secondary | ICD-10-CM

## 2020-11-05 DIAGNOSIS — Z30431 Encounter for routine checking of intrauterine contraceptive device: Secondary | ICD-10-CM

## 2020-11-05 NOTE — Progress Notes (Signed)
   GYNECOLOGY CLINIC PROGRESS NOTE  History:  40 y.o. [id number] here at [facility] today for today for IUD string check; Mirena IUD was placed  10/08/20. No complaints about the IUD, no concerning side effects.  The following portions of the patient's history were reviewed and updated as appropriate: allergies, current medications, past family history, past medical history, past social history, past surgical history and problem list. Last pap smear on 10/08/20 was normal, negative HRHPV.  Review of Systems:  Pertinent items are noted in HPI.   Objective:  Physical Exam Blood pressure 116/66, pulse 80, resp. rate 16, height 5\' 7"  (1.702 m), weight 217 lb (98.4 kg). Gen: NAD Abd: Soft, nontender and nondistended Pelvic: Normal appearing external genitalia; normal appearing vaginal mucosa and cervix.  IUD strings visualized, about 4 cm in length outside cervix.   Assessment & Plan:  Normal IUD check. Patient to keep IUD in place for five to seven years; can come in for removal if she desires pregnancy within the next five to seven years. Routine preventative health maintenance measures emphasized.  , CNM 8:35 AM

## 2020-11-06 DIAGNOSIS — X32XXXS Exposure to sunlight, sequela: Secondary | ICD-10-CM | POA: Diagnosis not present

## 2020-11-06 DIAGNOSIS — L814 Other melanin hyperpigmentation: Secondary | ICD-10-CM | POA: Diagnosis not present

## 2020-12-18 ENCOUNTER — Encounter: Payer: Self-pay | Admitting: Physician Assistant

## 2020-12-18 ENCOUNTER — Ambulatory Visit (INDEPENDENT_AMBULATORY_CARE_PROVIDER_SITE_OTHER): Payer: BC Managed Care – PPO | Admitting: Physician Assistant

## 2020-12-18 VITALS — BP 128/75 | HR 74 | Ht 67.0 in | Wt 213.0 lb

## 2020-12-18 DIAGNOSIS — Z13220 Encounter for screening for lipoid disorders: Secondary | ICD-10-CM

## 2020-12-18 DIAGNOSIS — Z1329 Encounter for screening for other suspected endocrine disorder: Secondary | ICD-10-CM

## 2020-12-18 DIAGNOSIS — Z79899 Other long term (current) drug therapy: Secondary | ICD-10-CM

## 2020-12-18 DIAGNOSIS — E6609 Other obesity due to excess calories: Secondary | ICD-10-CM | POA: Diagnosis not present

## 2020-12-18 DIAGNOSIS — Z23 Encounter for immunization: Secondary | ICD-10-CM | POA: Diagnosis not present

## 2020-12-18 DIAGNOSIS — Z131 Encounter for screening for diabetes mellitus: Secondary | ICD-10-CM | POA: Diagnosis not present

## 2020-12-18 DIAGNOSIS — Z6833 Body mass index (BMI) 33.0-33.9, adult: Secondary | ICD-10-CM

## 2020-12-18 DIAGNOSIS — R2241 Localized swelling, mass and lump, right lower limb: Secondary | ICD-10-CM

## 2020-12-18 DIAGNOSIS — M79671 Pain in right foot: Secondary | ICD-10-CM

## 2020-12-18 MED ORDER — SAXENDA 18 MG/3ML ~~LOC~~ SOPN: 3.0000 mg | PEN_INJECTOR | Freq: Every day | SUBCUTANEOUS | 0 refills | Status: DC

## 2020-12-18 NOTE — Progress Notes (Signed)
Subjective:    Patient ID: Jetta Lout, female    DOB: Oct 12, 1980, 40 y.o.   MRN: [id number]  HPI Pt is a 40 yo obese female who presents to the clinic to follow up on weight.   She is taking contrave, running and trying to eat right. She has lost 6lbs in 6 months. She would like to try something else to help her lose more weight.   She is having intermittent constipation and wants to know how to treat.   She started running over the summer. In the last month her right dorsal foot has been intermittently painful and swollen. Worse with walking and running.   .. 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   No energy 03/06/2016   GAD (generalized anxiety disorder) 03/06/2016   Class 1 obesity due to excess calories without serious comorbidity with body mass index (BMI) of 33.0 to 33.9 in adult 07/23/2019   Family history of colon cancer 08/05/2020   Right foot pain 12/20/2020   Localized swelling of right foot 12/20/2020   Resolved Ambulatory Problems    Diagnosis Date Noted   Supervision of normal first pregnancy in first trimester 02/03/2013   Rubella non-immune status, antepartum 02/07/2013   Low-lying placenta 06/01/2013   Supervision of normal first pregnancy 07/19/2013   PROM (premature rupture of membranes) 09/10/2013   Supervision of normal pregnancy, antepartum 06/03/2015   Obesity in pregnancy, antepartum 06/03/2015   Second trimester bleeding 07/02/2015   Placenta previa antepartum 08/16/2015   Choroid plexus cyst of fetus on prenatal ultrasound 08/16/2015   Placenta previa antepartum in second trimester 09/20/2015   Placenta previa with hemorrhage in second trimester 09/20/2015   Antepartum placental abruption 10/23/2015   Placental abruption in third trimester 12/08/2015   Status post vaginal delivery 12/09/2015   BMI 37.0-37.9, adult 12/04/2016   Past Medical History:   Diagnosis Date   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     Objective:   Physical Exam Vitals reviewed.  Constitutional:      Appearance: Normal appearance. She is obese.  HENT:     Head: Normocephalic.  Cardiovascular:     Rate and Rhythm: Normal rate and regular rhythm.  Pulmonary:     Effort: Pulmonary effort is normal.     Breath sounds: Normal breath sounds.  Musculoskeletal:     Right foot: Swelling present.       Legs:  Neurological:     General: No focal deficit present.     Mental Status: She is alert.          Assessment & Plan:  Marland KitchenMarland KitchenLupita was seen today for follow-up.  Diagnoses and all orders for this visit:  Class 1 obesity due to excess calories without serious comorbidity with body mass index (BMI) of 33.0 to 33.9 in adult -     Liraglutide -Weight Management (SAXENDA) 18 MG/3ML SOPN; Inject 3 mg into the skin daily. 0.6 mg inj subcut daily for 1 week, then incr by 0.6 mg weekly until reaching 3 mg injected subcut daily  Screening for diabetes mellitus -     COMPLETE METABOLIC PANEL WITH GFR  Screening for lipid disorders -     Lipid Panel w/reflex Direct LDL  Medication management -     TSH -  Lipid Panel w/reflex Direct LDL -     COMPLETE METABOLIC PANEL WITH GFR -     CBC with Differential/Platelet  Thyroid disorder screen -     TSH  Flu vaccine need -     Flu Vaccine QUAD 56mo+IM (Fluarix, Fluzone & Alfiuria Quad PF)  Right foot pain -     DG Foot Complete Right; Future  Localized swelling of right foot  .Marland KitchenDiscussed low carb diet with 1500 calories and 80g of protein.  Exercising at least 150 minutes a week.  [employer]itness Pal could be a Chief Technology Officer.  Stop contrave.  Start saxenda. Discussed side effects. Consider transition to wegovy if goes well. Pt aware of titration up.   Xray right foot for pain and swelling. Concerned about stress fracture. Could be more of a tendonitis.    Discussed probiotic and diet to help with constipation. Consider linzess. Likely saxenda will make it worse.

## 2020-12-19 ENCOUNTER — Other Ambulatory Visit: Payer: Self-pay

## 2020-12-19 ENCOUNTER — Ambulatory Visit (INDEPENDENT_AMBULATORY_CARE_PROVIDER_SITE_OTHER): Payer: BC Managed Care – PPO

## 2020-12-19 DIAGNOSIS — M79671 Pain in right foot: Secondary | ICD-10-CM

## 2020-12-19 DIAGNOSIS — M7989 Other specified soft tissue disorders: Secondary | ICD-10-CM | POA: Diagnosis not present

## 2020-12-19 DIAGNOSIS — M79672 Pain in left foot: Secondary | ICD-10-CM | POA: Diagnosis not present

## 2020-12-20 DIAGNOSIS — M79671 Pain in right foot: Secondary | ICD-10-CM | POA: Insufficient documentation

## 2020-12-20 DIAGNOSIS — R2241 Localized swelling, mass and lump, right lower limb: Secondary | ICD-10-CM | POA: Insufficient documentation

## 2020-12-23 ENCOUNTER — Encounter: Payer: Self-pay | Admitting: Physician Assistant

## 2020-12-23 NOTE — Progress Notes (Signed)
No acute findings. I do think the swelling could be related to some inflammation of tendons in foot. I would see sports medicine Dr. Karie Schwalbe for this. If mild tendonitis you do not want to continue running while inflamed. Over time you can cause chronic damage.

## 2020-12-27 ENCOUNTER — Other Ambulatory Visit: Payer: Self-pay | Admitting: Physician Assistant

## 2020-12-27 DIAGNOSIS — F411 Generalized anxiety disorder: Secondary | ICD-10-CM

## 2021-01-03 ENCOUNTER — Telehealth: Payer: Self-pay

## 2021-01-03 ENCOUNTER — Other Ambulatory Visit: Payer: Self-pay | Admitting: Physician Assistant

## 2021-01-03 DIAGNOSIS — Z6834 Body mass index (BMI) 34.0-34.9, adult: Secondary | ICD-10-CM

## 2021-01-03 DIAGNOSIS — E6609 Other obesity due to excess calories: Secondary | ICD-10-CM

## 2021-01-03 NOTE — Telephone Encounter (Signed)
Patient left another vm message. Can we please follow up with her and let her know what is happening with this Saxenda PA?

## 2021-01-03 NOTE — Telephone Encounter (Signed)
Medication: Liraglutide -Weight Management (SAXENDA) 18 MG/3ML SOPN Prior authorization submitted via CoverMyMeds on 01/03/2021 PA submission pending Pt aware via MyChart message

## 2021-01-06 ENCOUNTER — Encounter: Payer: Self-pay | Admitting: Physician Assistant

## 2021-01-06 NOTE — Telephone Encounter (Signed)
Medication:  Liraglutide -Weight Management (SAXENDA) 18 MG/3ML SOPN Prior authorization determination received Medication has been denied

## 2021-01-06 NOTE — Telephone Encounter (Signed)
Looks like this will need PA if we can not get [place] approved

## 2021-01-07 NOTE — Telephone Encounter (Signed)
Medication:  Liraglutide -Weight Management (SAXENDA) 18 MG/3ML SOPN Prior authorization re-submitted via CoverMyMeds on 01/07/2021 PA submission pending

## 2021-01-08 NOTE — Telephone Encounter (Signed)
Medication: Liraglutide -Weight Management (SAXENDA) 18 MG/3ML SOPN I called [employer] to check on the status of PA and was told it was still under review. I asked when we can expect a response and was told that we should have an answer by end of day on 01/12/21. Pt aware via MyChart portal message.

## 2021-01-13 NOTE — Telephone Encounter (Signed)
Medication:  Liraglutide -Weight Management (SAXENDA) 18 MG/3ML SOPN Prior authorization determination received Medication has been approved Approval dates: 01/03/2021-05/08/2021  Patient aware via: phone Pharmacy aware: Yes Provider aware via this encounter

## 2021-01-14 ENCOUNTER — Encounter: Payer: Self-pay | Admitting: Physician Assistant

## 2021-01-14 MED ORDER — NOVOFINE PEN NEEDLE 32G X 6 MM MISC: 1 refills | Status: DC

## 2021-01-14 NOTE — Telephone Encounter (Signed)
I'm unsure which pen needle pt needs.  Please send RX.  Tiajuana Amass, CMA

## 2021-01-23 NOTE — Telephone Encounter (Signed)
Medication:  Liraglutide -Weight Management (SAXENDA) 18 MG/3ML SOPN Prior authorization determination received Medication has been approved Approval dates: 01/03/2021-05/08/2021   Patient aware via: phone

## 2021-02-08 ENCOUNTER — Other Ambulatory Visit: Payer: Self-pay | Admitting: Physician Assistant

## 2021-02-08 DIAGNOSIS — E6609 Other obesity due to excess calories: Secondary | ICD-10-CM

## 2021-02-16 DIAGNOSIS — J069 Acute upper respiratory infection, unspecified: Secondary | ICD-10-CM | POA: Diagnosis not present

## 2021-02-16 DIAGNOSIS — H1030 Unspecified acute conjunctivitis, unspecified eye: Secondary | ICD-10-CM | POA: Diagnosis not present

## 2021-02-24 DIAGNOSIS — D1801 Hemangioma of skin and subcutaneous tissue: Secondary | ICD-10-CM | POA: Diagnosis not present

## 2021-02-24 DIAGNOSIS — X32XXXS Exposure to sunlight, sequela: Secondary | ICD-10-CM | POA: Diagnosis not present

## 2021-02-24 DIAGNOSIS — L814 Other melanin hyperpigmentation: Secondary | ICD-10-CM | POA: Diagnosis not present

## 2021-03-14 ENCOUNTER — Other Ambulatory Visit: Payer: Self-pay | Admitting: Physician Assistant

## 2021-03-14 DIAGNOSIS — E6609 Other obesity due to excess calories: Secondary | ICD-10-CM

## 2021-03-19 ENCOUNTER — Other Ambulatory Visit: Payer: Self-pay

## 2021-03-19 ENCOUNTER — Ambulatory Visit: Payer: BC Managed Care – PPO | Admitting: Physician Assistant

## 2021-03-19 ENCOUNTER — Encounter: Payer: Self-pay | Admitting: Physician Assistant

## 2021-03-19 VITALS — BP 119/77 | HR 88 | Resp 13 | Ht 67.0 in | Wt 199.5 lb

## 2021-03-19 DIAGNOSIS — Z6833 Body mass index (BMI) 33.0-33.9, adult: Secondary | ICD-10-CM | POA: Diagnosis not present

## 2021-03-19 DIAGNOSIS — Z6831 Body mass index (BMI) 31.0-31.9, adult: Secondary | ICD-10-CM | POA: Diagnosis not present

## 2021-03-19 DIAGNOSIS — E6609 Other obesity due to excess calories: Secondary | ICD-10-CM | POA: Diagnosis not present

## 2021-03-19 MED ORDER — SAXENDA 18 MG/3ML ~~LOC~~ SOPN: 3.0000 mg | PEN_INJECTOR | Freq: Every day | SUBCUTANEOUS | 1 refills | Status: DC

## 2021-03-19 NOTE — Progress Notes (Signed)
° °  Subjective:    Patient ID: Teresa Rojas, female    DOB: 30-Aug-1980, 40 y.o.   MRN: [id number]  HPI Pt is a 40 yo female who presents to the clinic to follow up on weight loss. She was 213lb at last visit in September and 199 today. She has only been on saxenda for 2 months and lost 14lbs. She is doing great. She is up to the 3mg  daily dosing and no problems or concerns. She is exercising regularly and watching her diet. She is interested in wegovy switch but concerned about actually being able to get it.   .. 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   No energy 03/06/2016   GAD (generalized anxiety disorder) 03/06/2016   Class 1 obesity due to excess calories without serious comorbidity with body mass index (BMI) of 31.0 to 31.9 in adult 07/23/2019   Family history of colon cancer 08/05/2020   Right foot pain 12/20/2020   Localized swelling of right foot 12/20/2020   Resolved Ambulatory Problems    Diagnosis Date Noted   Supervision of normal first pregnancy in first trimester 02/03/2013   Rubella non-immune status, antepartum 02/07/2013   Low-lying placenta 06/01/2013   Supervision of normal first pregnancy 07/19/2013   PROM (premature rupture of membranes) 09/10/2013   Supervision of normal pregnancy, antepartum 06/03/2015   Obesity in pregnancy, antepartum 06/03/2015   Second trimester bleeding 07/02/2015   Placenta previa antepartum 08/16/2015   Choroid plexus cyst of fetus on prenatal ultrasound 08/16/2015   Placenta previa antepartum in second trimester 09/20/2015   Placenta previa with hemorrhage in second trimester 09/20/2015   Antepartum placental abruption 10/23/2015   Placental abruption in third trimester 12/08/2015   Status post vaginal delivery 12/09/2015   BMI 37.0-37.9, adult 12/04/2016   Past Medical History:  Diagnosis Date   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 See HPI.     Objective:   Physical Exam Vitals reviewed.  Constitutional:      Appearance: Normal appearance. She is obese.  Cardiovascular:     Rate and Rhythm: Normal rate.  Pulmonary:     Effort: Pulmonary effort is normal.  Neurological:     General: No focal deficit present.     Mental Status: She is alert.  Psychiatric:        Mood and Affect: Mood normal.        Assessment & Plan:   11/07/2018Marland KitchenLaretha was seen today for follow-up.  Diagnoses and all orders for this visit:  Class 1 obesity due to excess calories without serious comorbidity with body mass index (BMI) of 31.0 to 31.9 in adult -     Liraglutide -Weight Management (SAXENDA) 18 MG/3ML SOPN; Inject 3 mg into the skin daily.  Class 1 obesity due to excess calories without serious comorbidity with body mass index (BMI) of 33.0 to 33.9 in adult -     Liraglutide -Weight Management (SAXENDA) 18 MG/3ML SOPN; Inject 3 mg into the skin daily.  Agree with patient Teresa Rojas is just not in stock enough right now to switch. Pt is tolerating well and lost 14lbs. Continue saxenda 3mg  daily Continue diet and exercise changes Follow up in 3 months to submit weight to insurance company.

## 2021-03-21 ENCOUNTER — Telehealth: Payer: Self-pay

## 2021-03-21 NOTE — Telephone Encounter (Signed)
Medication: Liraglutide -Weight Management (SAXENDA) 18 MG/3ML SOPN Prior authorization submitted via CoverMyMeds on 03/21/2021 PA submission pending

## 2021-04-21 ENCOUNTER — Encounter: Payer: Self-pay | Admitting: Physician Assistant

## 2021-04-22 MED ORDER — PEN NEEDLES 31G X 8 MM MISC: 99 refills | Status: DC

## 2021-04-23 ENCOUNTER — Telehealth: Payer: Self-pay

## 2021-04-23 NOTE — Telephone Encounter (Signed)
Medication: Liraglutide -Weight Management (SAXENDA) 18 MG/3ML SOPN Prior authorization submitted via CoverMyMeds on 04/23/2021 PA submission pending

## 2021-05-02 DIAGNOSIS — Z1329 Encounter for screening for other suspected endocrine disorder: Secondary | ICD-10-CM | POA: Diagnosis not present

## 2021-05-02 DIAGNOSIS — Z79899 Other long term (current) drug therapy: Secondary | ICD-10-CM | POA: Diagnosis not present

## 2021-05-02 DIAGNOSIS — Z131 Encounter for screening for diabetes mellitus: Secondary | ICD-10-CM | POA: Diagnosis not present

## 2021-05-02 DIAGNOSIS — Z13220 Encounter for screening for lipoid disorders: Secondary | ICD-10-CM | POA: Diagnosis not present

## 2021-05-02 LAB — COMPLETE METABOLIC PANEL WITH GFR
AG Ratio: 1.5 (calc) (ref 1.0–2.5)
ALT: 7 U/L (ref 6–29)
AST: 9 U/L — ABNORMAL LOW (ref 10–30)
Albumin: 4.2 g/dL (ref 3.6–5.1)
Alkaline phosphatase (APISO): 66 U/L (ref 31–125)
BUN: 13 mg/dL (ref 7–25)
CO2: 26 mmol/L (ref 20–32)
Calcium: 9.5 mg/dL (ref 8.6–10.2)
Chloride: 104 mmol/L (ref 98–110)
Creat: 0.72 mg/dL (ref 0.50–0.97)
Globulin: 2.8 g/dL (calc) (ref 1.9–3.7)
Glucose, Bld: 78 mg/dL (ref 65–99)
Potassium: 4.4 mmol/L (ref 3.5–5.3)
Sodium: 137 mmol/L (ref 135–146)
Total Bilirubin: 0.5 mg/dL (ref 0.2–1.2)
Total Protein: 7 g/dL (ref 6.1–8.1)
eGFR: 109 mL/min/{1.73_m2} (ref >=60)

## 2021-05-02 LAB — CBC WITH DIFFERENTIAL/PLATELET
Absolute Monocytes: 382 cells/uL (ref 200–950)
Basophils Absolute: 39 cells/uL (ref 0–200)
Basophils Relative: 0.8 %
Eosinophils Absolute: 88 cells/uL (ref 15–500)
Eosinophils Relative: 1.8 %
HCT: 39.4 % (ref 35.0–45.0)
Hemoglobin: 13 g/dL (ref 11.7–15.5)
Lymphs Abs: 1588 cells/uL (ref 850–3900)
MCH: 30 pg (ref 27.0–33.0)
MCHC: 33 g/dL (ref 32.0–36.0)
MCV: 91 fL (ref 80.0–100.0)
MPV: 10 fL (ref 7.5–12.5)
Monocytes Relative: 7.8 %
Neutro Abs: 2803 cells/uL (ref 1500–7800)
Neutrophils Relative %: 57.2 %
Platelets: 301 10*3/uL (ref 140–400)
RBC: 4.33 10*6/uL (ref 3.80–5.10)
RDW: 12.2 % (ref 11.0–15.0)
Total Lymphocyte: 32.4 %
WBC: 4.9 10*3/uL (ref 3.8–10.8)

## 2021-05-02 LAB — LIPID PANEL W/REFLEX DIRECT LDL
Cholesterol: 182 mg/dL (ref <200)
HDL: 57 mg/dL (ref >=50)
LDL Cholesterol (Calc): 110 mg/dL (calc) — ABNORMAL HIGH
Non-HDL Cholesterol (Calc): 125 mg/dL (calc) (ref <130)
Total CHOL/HDL Ratio: 3.2 (calc) (ref <5.0)
Triglycerides: 64 mg/dL (ref <150)

## 2021-05-02 LAB — TSH: TSH: 1.33 mIU/L

## 2021-05-02 NOTE — Telephone Encounter (Signed)
Received Notice of Denial ° °Placed in Breeback's basket °

## 2021-05-05 NOTE — Progress Notes (Signed)
Kidney, liver, glucose look great.  Thyroid looks wonderful.  Hemoglobin looks perfect.  Cholesterol has improved from 1 year ago.  LDL 110 goal to get under 100.  HDL great at 57!

## 2021-05-22 ENCOUNTER — Encounter: Payer: Self-pay | Admitting: Physician Assistant

## 2021-05-27 ENCOUNTER — Telehealth: Payer: Self-pay

## 2021-05-27 NOTE — Telephone Encounter (Signed)
Initiated Prior authorization [id number] 18MG /3ML pen-injectors Via: Covermymeds Case/Key:[id number] Status: Pending as of 05/27/21 Reason: Notified Pt via: Mychart

## 2021-06-17 ENCOUNTER — Other Ambulatory Visit: Payer: Self-pay

## 2021-06-17 ENCOUNTER — Encounter: Payer: Self-pay | Admitting: Physician Assistant

## 2021-06-17 ENCOUNTER — Ambulatory Visit: Payer: BC Managed Care – PPO | Admitting: Physician Assistant

## 2021-06-17 VITALS — BP 124/81 | HR 80 | Ht 67.0 in | Wt 200.0 lb

## 2021-06-17 DIAGNOSIS — Z6831 Body mass index (BMI) 31.0-31.9, adult: Secondary | ICD-10-CM | POA: Diagnosis not present

## 2021-06-17 DIAGNOSIS — E6609 Other obesity due to excess calories: Secondary | ICD-10-CM

## 2021-06-17 MED ORDER — WEGOVY 0.5 MG/0.5ML ~~LOC~~ SOAJ: 0.5000 mg | SUBCUTANEOUS | 0 refills | Status: DC

## 2021-06-17 NOTE — Progress Notes (Signed)
? ?  Subjective:  ? ? Patient ID: Teresa Rojas, female    DOB: [birth year], 41 y.o.   MRN: [id number] ? ?HPI ?Patient is a 41 year old obese female who presents to the clinic to discuss weight.  She is on Saxenda highest dose and has reached a plateau.  She is right at 200 pounds and has not lost since December.  She is noticing her appetite has not decreased like it was.  She is trying to stay active but not exercising regularly.  She is frustrated with this plateau.  She denies any side effects to medications. ?.. ?Active Ambulatory Problems  ?  Diagnosis Date Noted  ? IUD (intrauterine device) in place 10/17/2013  ? 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 No energy 03/06/2016  ? GAD (generalized anxiety disorder) 03/06/2016  ? Class 1 obesity due to excess calories without serious comorbidity with body mass index (BMI) of 31.0 to 31.9 in adult 07/23/2019  ? Family history of colon cancer 08/05/2020  ? Right foot pain 12/20/2020  ? Localized swelling of right foot 12/20/2020  ? ?Resolved Ambulatory Problems  ?  Diagnosis Date Noted  ? Supervision of normal first pregnancy in first trimester 02/03/2013  ? Rubella non-immune status, antepartum 02/07/2013  ? Low-lying placenta 06/01/2013  ? Supervision of normal first pregnancy 07/19/2013  ? PROM (premature rupture of membranes) 09/10/2013  ? Supervision of normal pregnancy, antepartum 06/03/2015  ? Obesity in pregnancy, antepartum 06/03/2015  ? Second trimester bleeding 07/02/2015  ? Placenta previa antepartum 08/16/2015  ? Choroid plexus cyst of fetus on prenatal ultrasound 08/16/2015  ? Placenta previa antepartum in second trimester 09/20/2015  ? Placenta previa with hemorrhage in second trimester 09/20/2015  ? Antepartum placental abruption 10/23/2015  ? Placental abruption in third trimester 12/08/2015  ? Status post vaginal delivery 12/09/2015  ? BMI 37.0-37.9, adult 12/04/2016  ? ?Past Medical History:  ?Diagnosis Date  ? 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 Vision  loss   ? ? ? ? ?Review of Systems  ?All other systems reviewed and are negative. ? ?   ?Objective:  ? Physical Exam ?Vitals reviewed.  ?Constitutional:   ?   Appearance: Normal appearance.  ?HENT:  ?   Head: Normocephalic.  ?   Right Ear: Tympanic membrane normal.  ?   Left Ear: Tympanic membrane normal.  ?Cardiovascular:  ?   Rate and Rhythm: Normal rate and regular rhythm.  ?Pulmonary:  ?   Effort: Pulmonary effort is normal.  ?   Breath sounds: Normal breath sounds.  ?Neurological:  ?   General: No focal deficit present.  ?   Mental Status: She is alert and oriented to person, place, and time.  ?Psychiatric:     ?   Mood and Affect: Mood normal.  ? ? ? ? ? ?   ?Assessment & Plan:  ?..Teresa Rojas was seen today for obesity. ? ?Diagnoses and all orders for this visit: ? ?Class 1 obesity due to excess calories without serious comorbidity with body mass index (BMI) of 31.0 to 31.9 in adult ?-     WEGOVY 0.5 MG/0.5ML SOAJ; Inject 0.5 mg into the skin once a week. Use this dose for 1 month (4 shots) and then increase to next higher dose. ? ? ?No weight loss over last 3 months ?Stop saxenda ?Start wegovy once a week ?Discussed coupon card and side effects ?Will titrate monthly ?Follow up in 6 months.  ? ?

## 2021-06-20 ENCOUNTER — Encounter: Payer: Self-pay | Admitting: Physician Assistant

## 2021-06-25 ENCOUNTER — Telehealth: Payer: Self-pay

## 2021-06-25 NOTE — Telephone Encounter (Signed)
Initiated Prior authorization YQM:GNOIBB 0.5MG /0.5ML auto-injectors ?Via: Covermymeds ?Case/Key:[id number] ?Status: approved as of 06/25/21 ?Reason: Effective from 06/17/2021 through 10/20/2021.  ?Notified Pt via: Mychart ?

## 2021-07-02 ENCOUNTER — Other Ambulatory Visit: Payer: Self-pay | Admitting: Physician Assistant

## 2021-07-02 DIAGNOSIS — F411 Generalized anxiety disorder: Secondary | ICD-10-CM

## 2021-07-20 ENCOUNTER — Other Ambulatory Visit: Payer: Self-pay | Admitting: Physician Assistant

## 2021-07-20 DIAGNOSIS — Z6831 Body mass index (BMI) 31.0-31.9, adult: Secondary | ICD-10-CM

## 2021-07-20 DIAGNOSIS — E6609 Other obesity due to excess calories: Secondary | ICD-10-CM

## 2021-07-21 MED ORDER — WEGOVY 1 MG/0.5ML ~~LOC~~ SOAJ: 1.0000 mg | SUBCUTANEOUS | 0 refills | Status: DC

## 2021-07-22 ENCOUNTER — Encounter: Payer: Self-pay | Admitting: Physician Assistant

## 2021-08-18 ENCOUNTER — Other Ambulatory Visit: Payer: Self-pay | Admitting: Physician Assistant

## 2021-08-18 MED ORDER — WEGOVY 1.7 MG/0.75ML ~~LOC~~ SOAJ: 1.7000 mg | SUBCUTANEOUS | 0 refills | Status: DC

## 2021-09-18 ENCOUNTER — Other Ambulatory Visit: Payer: Self-pay | Admitting: Physician Assistant

## 2021-10-15 ENCOUNTER — Other Ambulatory Visit: Payer: Self-pay | Admitting: Physician Assistant

## 2021-11-04 ENCOUNTER — Encounter: Payer: Self-pay | Admitting: Physician Assistant

## 2021-11-21 ENCOUNTER — Encounter: Payer: Self-pay | Admitting: Physician Assistant

## 2021-12-05 ENCOUNTER — Telehealth: Payer: Self-pay

## 2021-12-05 NOTE — Telephone Encounter (Addendum)
Initiated Prior authorization WZD:VIPNYO 2.4MG /0.75ML auto-injectors  Via: Covermymeds Case/Key:[id number] Status: denied  as of 12/05/21 Reason:medical criteria not met  Notified Pt via: Mychart

## 2021-12-19 ENCOUNTER — Encounter: Payer: Self-pay | Admitting: Physician Assistant

## 2021-12-19 ENCOUNTER — Ambulatory Visit: Payer: BC Managed Care – PPO | Admitting: Physician Assistant

## 2021-12-19 VITALS — BP 103/64 | HR 70 | Ht 67.0 in | Wt 189.0 lb

## 2021-12-19 DIAGNOSIS — Z23 Encounter for immunization: Secondary | ICD-10-CM | POA: Diagnosis not present

## 2021-12-19 DIAGNOSIS — E663 Overweight: Secondary | ICD-10-CM

## 2021-12-19 DIAGNOSIS — F411 Generalized anxiety disorder: Secondary | ICD-10-CM

## 2021-12-19 DIAGNOSIS — Z79899 Other long term (current) drug therapy: Secondary | ICD-10-CM | POA: Diagnosis not present

## 2021-12-19 MED ORDER — CITALOPRAM HYDROBROMIDE 40 MG PO TABS: 40.0000 mg | ORAL_TABLET | Freq: Every day | ORAL | 3 refills | Status: DC

## 2021-12-19 MED ORDER — AMBULATORY NON FORMULARY MEDICATION: 1 refills | Status: DC

## 2021-12-19 NOTE — Patient Instructions (Signed)
Start wegovy 12 units weeky for one month then increase by 12 units monthly until at 48 units or does not tolerate.

## 2021-12-19 NOTE — Progress Notes (Signed)
Established Patient Office Visit  Subjective   Patient ID: Teresa Rojas, female    DOB: 1980-10-28  Age: 41 y.o. MRN: [id number]  Chief Complaint  Patient presents with   Follow-up    HPI Pt is a 41 yo female who presents to the clinic to follow up on weight and GAD. She had been on wegovy and done well with weight loss until it was not approved any more. She denies any side effects. She would like to go back on it. She is down 10lbs overall today. Her mood is great. No concerns. No SI/HC. She is staying active and keeping a healthy diet.   .. 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   No energy 03/06/2016   GAD (generalized anxiety disorder) 03/06/2016   Class 1 obesity due to excess calories without serious comorbidity with body mass index (BMI) of 31.0 to 31.9 in adult 07/23/2019   Family history of colon cancer 08/05/2020   Right foot pain 12/20/2020   Localized swelling of right foot 12/20/2020   Overweight (BMI 25.0-29.9) 12/19/2021   Resolved Ambulatory Problems    Diagnosis Date Noted   Supervision of normal first pregnancy in first trimester 02/03/2013   Rubella non-immune status, antepartum 02/07/2013   Low-lying placenta 06/01/2013   Supervision of normal first pregnancy 07/19/2013   PROM (premature rupture of membranes) 09/10/2013   Supervision of normal pregnancy, antepartum 06/03/2015   Obesity in pregnancy, antepartum 06/03/2015   Second trimester bleeding 07/02/2015   Placenta previa antepartum 08/16/2015   Choroid plexus cyst of fetus on prenatal ultrasound 08/16/2015   Placenta previa antepartum in second trimester 09/20/2015   Placenta previa with hemorrhage in second trimester 09/20/2015   Antepartum placental abruption 10/23/2015   Placental abruption in third trimester 12/08/2015   Status post vaginal delivery 12/09/2015   BMI 37.0-37.9, adult 12/04/2016    Past Medical History:  Diagnosis Date   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    12/19/2021   11:14 AM 06/17/2021   11:14 AM 12/18/2020    1:39 PM 12/20/2019   10:56 AM 07/17/2019   11:21 AM  Depression screen PHQ 2/9  Decreased Interest 0 0 0 0 0  Down, Depressed, Hopeless 0 0 0 0 1  PHQ - 2 Score 0 0 0 0 1  Altered sleeping 0   0 0  Tired, decreased energy 0   2 1  Change in appetite 0   1 1  Feeling bad or failure about yourself  0   2 0  Trouble concentrating 0   1 2  Moving slowly or fidgety/restless 0   1 0  Suicidal thoughts 0   0 0  PHQ-9 Score 0   7 5  Difficult doing work/chores Not difficult at all   Not difficult at all Not  difficult at all   ..    12/19/2021   11:16 AM 12/20/2019   10:56 AM 07/17/2019   11:24 AM 06/28/2018   11:11 AM  GAD 7 : Generalized Anxiety Score  Nervous, Anxious, on Edge 1 2 2 3   Control/stop worrying 0 1 1 3   Worry too much - different things 0 2 1 3   Trouble relaxing 1 2 0 1  Restless 0 2 1 3   Easily annoyed or irritable 0 2 1 1   Afraid - awful might happen 0 0 1 1  Total GAD 7 Score 2 11 7 15   Anxiety Difficulty Not difficult at all Somewhat difficult Not difficult at all Not difficult at all       Physical Exam Constitutional:      Appearance: Normal appearance.  Cardiovascular:     Rate and Rhythm: Normal rate and regular rhythm.  Pulmonary:     Effort: Pulmonary effort is normal.     Breath sounds: Normal breath sounds.  Musculoskeletal:     Right lower leg: No edema.     Left lower leg: No edema.  Neurological:     General: No focal deficit  present.     Mental Status: She is alert and oriented to person, place, and time.  Psychiatric:        Mood and Affect: Mood normal.         Assessment & Plan:  Marland KitchenMarland KitchenOzie was seen today for follow-up.  Diagnoses and all orders for this visit:  Overweight (BMI 25.0-29.9) -     AMBULATORY NON FORMULARY MEDICATION; Semaglutide 2.5mg  with pyridoxine 10mg  per mL  Inject .3mg (12 units) subcutaneously once weekly for 4 weeks then increase by 12 units weekly as tolerated or get to 48 units.  Medication management -     COMPLETE METABOLIC PANEL WITH GFR  Flu vaccine need -     Flu Vaccine QUAD 89mo+IM (Fluarix, Fluzone & Alfiuria Quad PF)  GAD (generalized anxiety disorder) -     citalopram (CELEXA) 40 MG tablet; Take 1 tablet (40 mg total) by mouth daily.   PHQ/GAD no concerns Refilled celexa Cmp ordered .Marland KitchenDiscussed low carb diet with 1500 calories and 80g of protein.  Exercising at least 150 minutes a week.  My Fitness Pal could be a Microbiologist.  She is overweight Sent wegovy to titrate back up    Return in about 6 months (around 06/19/2022).    Iran Planas, PA-C

## 2021-12-20 LAB — COMPLETE METABOLIC PANEL WITH GFR
AG Ratio: 1.5 (calc) (ref 1.0–2.5)
ALT: 10 U/L (ref 6–29)
AST: 8 U/L — ABNORMAL LOW (ref 10–30)
Albumin: 4.2 g/dL (ref 3.6–5.1)
Alkaline phosphatase (APISO): 69 U/L (ref 31–125)
BUN: 17 mg/dL (ref 7–25)
CO2: 28 mmol/L (ref 20–32)
Calcium: 9.2 mg/dL (ref 8.6–10.2)
Chloride: 102 mmol/L (ref 98–110)
Creat: 0.66 mg/dL (ref 0.50–0.99)
Globulin: 2.8 g/dL (calc) (ref 1.9–3.7)
Glucose, Bld: 70 mg/dL (ref 65–99)
Potassium: 4.7 mmol/L (ref 3.5–5.3)
Sodium: 136 mmol/L (ref 135–146)
Total Bilirubin: 0.4 mg/dL (ref 0.2–1.2)
Total Protein: 7 g/dL (ref 6.1–8.1)
eGFR: 114 mL/min/{1.73_m2} (ref >=60)

## 2021-12-22 NOTE — Progress Notes (Signed)
Looks good

## 2021-12-26 ENCOUNTER — Ambulatory Visit (INDEPENDENT_AMBULATORY_CARE_PROVIDER_SITE_OTHER): Payer: BC Managed Care – PPO | Admitting: Physician Assistant

## 2021-12-26 ENCOUNTER — Encounter: Payer: Self-pay | Admitting: Physician Assistant

## 2021-12-26 VITALS — BP 117/73 | HR 70 | Ht 67.0 in | Wt 191.0 lb

## 2021-12-26 DIAGNOSIS — Z1231 Encounter for screening mammogram for malignant neoplasm of breast: Secondary | ICD-10-CM | POA: Diagnosis not present

## 2021-12-26 DIAGNOSIS — Z800 Family history of malignant neoplasm of digestive organs: Secondary | ICD-10-CM | POA: Diagnosis not present

## 2021-12-26 DIAGNOSIS — Z0000 Encounter for general adult medical examination without abnormal findings: Secondary | ICD-10-CM | POA: Diagnosis not present

## 2021-12-26 NOTE — Patient Instructions (Signed)

## 2021-12-26 NOTE — Progress Notes (Signed)
Complete physical exam  Patient: Teresa Rojas   DOB: [birth year]   41 y.o. Female  MRN: [id number]  Subjective:    Chief Complaint  Patient presents with   Annual Exam    Teresa Rojas is a 41 y.o. female who presents today for a complete physical exam. She reports consuming a general diet.  She is very active and regularly walks!  She generally feels well. She reports sleeping well. She does not have additional problems to discuss today.    Most recent fall risk assessment:    06/17/2021   11:13 AM  Fall Risk   Falls in the past year? 0  Number falls in past yr: 0  Injury with Fall? 0  Risk for fall due to : No Fall Risks  Follow up Falls evaluation completed     Most recent depression screenings:    12/26/2021   11:10 AM 12/19/2021   11:14 AM  PHQ 2/9 Scores  PHQ - 2 Score 0 0  PHQ- 9 Score 0 0    Vision:Within last year and Dental: No current dental problems  Patient Active Problem List   Diagnosis Date Noted   Overweight (BMI 25.0-29.9) 12/19/2021   Right foot pain 12/20/2020   Localized swelling of right foot 12/20/2020   Family history of colon cancer 08/05/2020   Class 1 obesity due to excess calories without serious comorbidity with body mass index (BMI) of 31.0 to 31.9 in adult 07/23/2019   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   No energy 03/06/2016   GAD (generalized anxiety disorder) 03/06/2016   IUD (intrauterine device) in place 10/17/2013   Past Medical History:  Diagnosis Date   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   citalopram (CELEXA) 40 MG tablet Take 1 tablet (40 mg total) by mouth daily.   ibuprofen (ADVIL,MOTRIN) 600 MG tablet Take 1 tablet (600 mg total) by mouth every 6 (six) hours.   Insulin Pen Needle (PEN NEEDLES) 31G X 8 MM MISC Inject into skin once daily.   levonorgestrel (MIRENA) 20 MCG/DAY IUD 1 each by Intrauterine route once.   tobramycin (TOBREX) 0.3 % ophthalmic solution 1 drop every 6 (six) hours.   No facility-administered medications prior to visit.    Review of Systems  All other systems reviewed and are negative.        Objective:     BP 117/73   Pulse 70   Ht _0  (1.702 m)   Wt 191 lb (86.6 kg)   SpO2 100%   BMI 29.91 kg/m  BP Readings from Last 3 Encounters:  12/26/21 117/73  12/19/21 103/64  06/17/21 124/81   Wt Readings from Last 3 Encounters:  12/26/21 191 lb (86.6 kg)  12/19/21 189 lb (85.7 kg)  06/17/21 200 lb (90.7 kg)      Physical Exam  BP 117/73   Pulse 70   Ht _1  (1.702 m)  Wt 191 lb (86.6 kg)   SpO2 100%   BMI 29.91 kg/m   General Appearance:    Alert, cooperative, no distress, appears stated age  Head:    Normocephalic, without obvious abnormality, atraumatic  Eyes:    PERRL, conjunctiva/corneas clear, EOM's intact, fundi    benign, both eyes  Ears:    Normal TM's and external ear canals, both ears  Nose:   Nares normal, septum midline, mucosa normal, no drainage    or sinus tenderness  Throat:   Lips, mucosa, and tongue normal; teeth and gums normal  Neck:   Supple, symmetrical, trachea midline, no adenopathy;    thyroid:  no enlargement/tenderness/nodules; no carotid   bruit or JVD  Back:     Symmetric, no curvature, ROM normal, no CVA tenderness  Lungs:     Clear to auscultation bilaterally, respirations unlabored  Chest Wall:    No tenderness or deformity   Heart:    Regular rate and rhythm, S1 and S2 normal, no murmur, rub   or gallop      Abdomen:     Soft, non-tender, bowel sounds active all four quadrants,    no masses, no organomegaly         Extremities:   Extremities normal, atraumatic, no cyanosis or edema  Pulses:   2+ and symmetric all extremities  Skin:   Skin color, texture, turgor normal, no rashes or lesions  Lymph nodes:   Cervical, supraclavicular, and axillary nodes normal  Neurologic:   CNII-XII intact, normal strength, sensation and reflexes    Throughout     RECTAL: negative hemoccult, no masses or abnormal findings to visual inspection.    Assessment & Plan:    Routine Health Maintenance and Physical Exam  Immunization History  Administered Date(s) Administered   Influenza Inj Mdck Quad Pf 12/24/2017   Influenza Split 02/24/2013   Influenza,inj,Quad PF,6+ Mos 12/14/2013, 01/20/2016, 12/04/2016, 12/24/2017, 12/19/2018, 12/20/2019, 12/18/2020, 12/19/2021   Influenza-Unspecified 02/24/2013, 12/24/2017   MMR 09/12/2013   Moderna Sars-Covid-2 Vaccination 06/05/2019, 07/04/2019   PFIZER Comirnaty(Gray Top)Covid-19 Tri-Sucrose Vaccine 03/19/2020   PFIZER(Purple Top)SARS-COV-2 Vaccination 03/19/2020   Tdap 07/05/2013, 10/09/2015    Health Maintenance  Topic Date Due   Hepatitis C Screening  Never done   COVID-19 Vaccine (5 - Mixed Product risk series) 01/04/2022 (Originally 05/14/2020)   PAP SMEAR-Modifier  10/08/2025   TETANUS/TDAP  10/08/2025   INFLUENZA VACCINE  Completed   HIV Screening  Completed   HPV VACCINES  Aged Out    Discussed health benefits of physical activity, and encouraged her to engage in regular exercise appropriate for her age and condition.   Marland KitchenLanelle Bal was seen today for annual exam.  Diagnoses and all orders for this visit:  Routine physical examination  Visit for screening mammogram -     MM 3D SCREEN BREAST BILATERAL; Future  Family history of colon cancer   .Marland Kitchen Discussed 150 minutes of exercise a week.  Encouraged vitamin D 1000 units and Calcium 1323m  or 4 servings of dairy a day.  PHQ no concerns Mammogram ordered Pap UTD Due to family hx of colon cancer with mother at 680she is concerned Start screening at 440Hemoccult cards yearly until then Follow up in 1 year.     JIran Planas PA-C

## 2022-01-07 ENCOUNTER — Ambulatory Visit (INDEPENDENT_AMBULATORY_CARE_PROVIDER_SITE_OTHER): Payer: BC Managed Care – PPO

## 2022-01-07 DIAGNOSIS — Z1231 Encounter for screening mammogram for malignant neoplasm of breast: Secondary | ICD-10-CM

## 2022-01-27 NOTE — Progress Notes (Signed)
Mammogram showed possible left breast mass. More imaging is needed to better evaluate. Imaging will call and schedule appt.

## 2022-01-28 ENCOUNTER — Other Ambulatory Visit: Payer: Self-pay | Admitting: Physician Assistant

## 2022-01-28 DIAGNOSIS — R928 Other abnormal and inconclusive findings on diagnostic imaging of breast: Secondary | ICD-10-CM

## 2022-02-09 ENCOUNTER — Other Ambulatory Visit: Payer: Self-pay | Admitting: Physician Assistant

## 2022-02-09 ENCOUNTER — Ambulatory Visit
Admission: RE | Admit: 2022-02-09 | Discharge: 2022-02-09 | Disposition: A | Discharge status: Home / Self Care | Payer: BC Managed Care – PPO | Source: Ambulatory Visit | Attending: Physician Assistant | Admitting: Physician Assistant

## 2022-02-09 DIAGNOSIS — R92322 Mammographic fibroglandular density, left breast: Secondary | ICD-10-CM | POA: Diagnosis not present

## 2022-02-09 DIAGNOSIS — R928 Other abnormal and inconclusive findings on diagnostic imaging of breast: Secondary | ICD-10-CM

## 2022-02-09 DIAGNOSIS — N6489 Other specified disorders of breast: Secondary | ICD-10-CM | POA: Diagnosis not present

## 2022-02-09 NOTE — Progress Notes (Signed)
Biopsied has been ordered of left breast.

## 2022-02-13 ENCOUNTER — Ambulatory Visit
Admission: RE | Admit: 2022-02-13 | Discharge: 2022-02-13 | Disposition: A | Discharge status: Home / Self Care | Payer: BC Managed Care – PPO | Source: Ambulatory Visit | Attending: Physician Assistant | Admitting: Physician Assistant

## 2022-02-13 DIAGNOSIS — N6032 Fibrosclerosis of left breast: Secondary | ICD-10-CM | POA: Diagnosis not present

## 2022-02-13 DIAGNOSIS — R928 Other abnormal and inconclusive findings on diagnostic imaging of breast: Secondary | ICD-10-CM

## 2022-02-13 HISTORY — PX: BREAST BIOPSY: SHX20

## 2022-02-25 DIAGNOSIS — D2371 Other benign neoplasm of skin of right lower limb, including hip: Secondary | ICD-10-CM | POA: Diagnosis not present

## 2022-02-25 DIAGNOSIS — D235 Other benign neoplasm of skin of trunk: Secondary | ICD-10-CM | POA: Diagnosis not present

## 2022-02-25 DIAGNOSIS — Z129 Encounter for screening for malignant neoplasm, site unspecified: Secondary | ICD-10-CM | POA: Diagnosis not present

## 2022-02-25 DIAGNOSIS — D225 Melanocytic nevi of trunk: Secondary | ICD-10-CM | POA: Diagnosis not present

## 2022-06-19 ENCOUNTER — Ambulatory Visit: Payer: BC Managed Care – PPO | Admitting: Physician Assistant

## 2022-07-07 ENCOUNTER — Encounter: Payer: Self-pay | Admitting: Physician Assistant

## 2022-07-07 ENCOUNTER — Ambulatory Visit: Payer: BC Managed Care – PPO | Admitting: Physician Assistant

## 2022-07-07 VITALS — BP 102/61 | HR 100 | Ht 67.0 in | Wt 213.0 lb

## 2022-07-07 DIAGNOSIS — E6609 Other obesity due to excess calories: Secondary | ICD-10-CM

## 2022-07-07 DIAGNOSIS — R635 Abnormal weight gain: Secondary | ICD-10-CM | POA: Insufficient documentation

## 2022-07-07 MED ORDER — NALTREXONE-BUPROPION HCL ER 8-90 MG PO TB12: ORAL_TABLET | ORAL | 0 refills | Status: DC

## 2022-07-07 MED ORDER — NALTREXONE-BUPROPION HCL ER 8-90 MG PO TB12: 2.0000 | ORAL_TABLET | Freq: Two times a day (BID) | ORAL | 2 refills | Status: DC

## 2022-07-07 NOTE — Patient Instructions (Signed)
Phentermine; Topiramate Extended-Release Capsules What is this medication? Phentermine; Topiramate (FEN ter meen; Toe PYRE a mate) promotes weight loss. It may also be used to maintain weight loss. It works by decreasing appetite. Changes to diet and exercise are often combined with this medication. This medicine may be used for other purposes; ask your health care provider or pharmacist if you have questions. COMMON BRAND NAME(S): Qsymia What should I tell my care team before I take this medication? They need to know if you have any of these conditions: Bone problems Depression or other mental health condition Diabetes Diarrhea Glaucoma Having surgery Heart disease High blood pressure History of heart attack or stroke History of irregular heartbeat History of substance use disorder or alcohol use disorder Kidney disease or stones Liver disease Low levels of potassium in the blood Lung or breathing disease, like asthma Metabolic acidosis On a ketogenic diet Seizures Suicidal thoughts, plans, or attempt by you or a family member Taken an MAOI like Carbex, Eldepryl, Marplan, Nardil, or Parnate in last 14 days Thyroid disease An unusual or allergic reaction to phentermine, topiramate, other medications, foods, dyes, or preservatives Pregnant or trying to get pregnant Breast-feeding How should I use this medication? Take this medication by mouth with a glass of water. Follow the directions on the prescription label. Do not crush or chew. This medication is usually taken with or without food once per day in the morning. Avoid taking this medication in the evening. It may interfere with sleep. Take your doses at regular intervals. Do not take your medication more often than directed. A special MedGuide will be given to you by the pharmacist with each prescription and refill. Be sure to read this information carefully each time. Talk to your care team about the use of this medication in  children. While it may be prescribed for children as young as 12 years for selected conditions, precautions do apply. Overdosage: If you think you have taken too much of this medicine contact a poison control center or emergency room at once. NOTE: This medicine is only for you. Do not share this medicine with others. What if I miss a dose? If you miss a dose, skip it. Take your next dose at the normal time. Do not take extra or 2 doses at the same time to make up for the missed dose. What may interact with this medication? Do not take this medication with any of the following: MAOIs like Carbex, Eldepryl, Marplan, Nardil, and Parnate This medication may also interact with the following: Acetazolamide Alcohol Antihistamines for allergy, cough and cold Atropine Birth control pills Carbamazepine Certain medications for bladder problems like oxybutynin, tolterodine Certain medications for depression, anxiety, or psychotic disturbances Certain medications for high blood pressure Certain medications for Parkinson disease like benztropine, trihexyphenidyl Certain medications for sleep Certain medications for stomach problems like dicyclomine, hyoscyamine Certain medications for travel sickness like scopolamine Dichlorphenamide Digoxin Diuretics Linezolid Medications for colds or breathing difficulties like pseudoephedrine or phenylephrine Medications for diabetes Methazolamide Narcotic medications for pain Phenytoin Sibutramine Stimulant medications for attention disorders, weight loss, or to stay awake Valproic acid Zonisamide This list may not describe all possible interactions. Give your health care provider a list of all the medicines, herbs, non-prescription drugs, or dietary supplements you use. Also tell them if you smoke, drink alcohol, or use illegal drugs. Some items may interact with your medicine. What should I watch for while using this medication? Visit your care team for  regular checks on   your progress. Do not stop taking this medication except on your care team's advice. You may develop a severe reaction. Your care team will tell you how much medication to take. Do not take this medication close to bedtime. It may prevent you from sleeping. Avoid extreme heat. This medication can cause you to sweat less than normal. Your body temperature could increase to dangerous levels, which may lead to heat stroke. You should drink plenty of fluids while taking this medication. If you have had kidney stones in the past, this will help to reduce your chances of forming kidney stones. You may get drowsy or dizzy. Do not drive, use machinery, or do anything that needs mental alertness until you know how this medication affects you. Do not stand or sit up quickly, especially if you are an older patient. This reduces the risk of dizzy or fainting spells. Alcohol may increase dizziness and drowsiness. Avoid alcoholic drinks. This medication may affect blood sugar levels. Ask your care team if changes in diet or medications are needed if you have diabetes. Check with your care team if you have severe diarrhea, nausea, and vomiting, or if you sweat a lot. The loss of too much body fluid may make it dangerous for you to take this medication. Tell your care team right away if you have any change in your eyesight. Watch for new or worsening thoughts of suicide or depression. This includes sudden changes in mood, behaviors, or thoughts. These changes can happen at any time but are more common in the beginning of treatment or after a change in dose. Call your care team right away if you experience these thoughts or worsening depression. This medication may cause serious skin reactions. They can happen weeks to months after starting the medication. Contact your care team right away if you notice fevers or flu-like symptoms with a rash. The rash may be red or purple and then turn into blisters or  peeling of the skin. Or, you might notice a red rash with swelling of the face, lips or lymph nodes in your neck or under your arms. Birth control may not work properly while you are taking this medication. Talk to your care team about using an extra method of birth control. Tell your care team if you wish to become pregnant or think you might be pregnant. There is a potential for serious side effects and harm to an unborn child. Losing weight while pregnant is not advised and may cause harm to the unborn child. Talk to your care team for more information. What side effects may I notice from receiving this medication? Side effects that you should report to your care team as soon as possible: Allergic reactions--skin rash, itching, hives, swelling of the face, lips, tongue, or throat Difficulty with paying attention, memory, or speech Fast or irregular heartbeat Fever that does not go away, decreased sweating High acid level--trouble breathing, unusual weakness or fatigue, confusion, headache, fast or irregular heartbeat, nausea, vomiting High ammonia level--unusual weakness or fatigue, confusion, loss of appetite, nausea, vomiting, seizures Kidney injury--decrease in the amount of urine, swelling of the ankles, hands, or feet Kidney stones--blood in the urine, pain or trouble passing urine, pain in the lower back or sides Low potassium level--muscle pain or cramps, unusual weakness or fatigue, fast or irregular heartbeat, constipation Mood and behavior changes--anxiety, nervousness, confusion, hallucinations, irritability, hostility, thoughts of suicide or self-harm, worsening mood, feelings of depression Redness, blistering, peeling, or loosening of the skin, including inside   the mouth Sudden eye pain or change in vision such as blurry vision, seeing halos around lights, vision loss Side effects that usually do not require medical attention (report to your care team if they continue or are  bothersome): Burning or tingling sensation in hands or feet Change in taste Constipation Dizziness Dry mouth Trouble sleeping This list may not describe all possible side effects. Call your doctor for medical advice about side effects. You may report side effects to FDA at 1-800-FDA-1088. Where should I keep my medication? Keep out of the reach of children and pets. This medication can be abused. Keep it in a safe place to protect it from theft. Do not share it with anyone. It is only for you. Selling or giving away this medication is dangerous and against the law. Store at room temperature between 15 and 25 degrees C (59 and 77 degrees F). Protect from moisture. Keep the container tightly closed. This medication may cause harm and death if it is taken by other adults, children, or pets. It is important to get rid of the medication as soon as you no longer need it, or if it is expired. You can do this in two ways: Take the medication to a medication take-back program. Check with your pharmacy or law enforcement to find a location. If you cannot return the medication, check the label or package insert to see if the medication should be thrown out in the garbage or flushed down the toilet. If you are not sure, ask your care team. If it is safe to put in the trash, take the medication out of the container. Mix the medication with cat litter, dirt, coffee grounds, or other unwanted substance. Seal the mixture in a bag or container. Put it in the trash. NOTE: This sheet is a summary. It may not cover all possible information. If you have questions about this medicine, talk to your doctor, pharmacist, or health care provider.  2023 Elsevier/Gold Standard (2021-10-02 00:00:00)  

## 2022-07-07 NOTE — Progress Notes (Signed)
Established Patient Office Visit  Subjective   Patient ID: Teresa Rojas, female    DOB: 09-Apr-1980  Age: 42 y.o. MRN: [id number]  Chief Complaint  Patient presents with   Follow-up    HPI Pt is a 42 yo obese female who would like to discuss weight gain. She has gained 25lbs in the last 6 months. She was on contrave and switched to wegovy. She could never get wegovy except compounding pharmacy and just never worked out. She would like to discuss other medications. She is walking some and trying to stay active but admits to many food cravings and binging.   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   No energy 03/06/2016   GAD (generalized anxiety disorder) 03/06/2016   Class 1 obesity due to excess calories without serious comorbidity with body mass index (BMI) of 33.0 to 33.9 in adult 07/23/2019   Family history of colon cancer 08/05/2020   Right foot pain 12/20/2020   Localized swelling of right foot 12/20/2020   Overweight (BMI 25.0-29.9) 12/19/2021   Abnormal weight gain 07/07/2022   Resolved Ambulatory Problems    Diagnosis Date Noted   Supervision of normal first pregnancy in first trimester 02/03/2013   Rubella non-immune status, antepartum 02/07/2013   Low-lying placenta 06/01/2013   Supervision of normal first pregnancy 07/19/2013   PROM (premature rupture of membranes) 09/10/2013   Supervision of normal pregnancy, antepartum 06/03/2015   Obesity in pregnancy, antepartum 06/03/2015   Second trimester bleeding 07/02/2015   Placenta previa antepartum 08/16/2015   Choroid plexus cyst of fetus on prenatal ultrasound 08/16/2015   Placenta previa antepartum in second trimester 09/20/2015   Placenta previa with hemorrhage in second trimester 09/20/2015   Antepartum placental abruption 10/23/2015   Placental abruption in third trimester 12/08/2015   Status post vaginal delivery  12/09/2015   BMI 37.0-37.9, adult 12/04/2016   Past Medical History:  Diagnosis Date   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     Objective:     BP 102/61   Pulse 100   Ht 5\' 7"  (1.702 m)   Wt 213 lb (96.6 kg) Comment: 213lb  SpO2 100%   BMI 33.36 kg/m  BP Readings from Last 3 Encounters:  07/07/22 102/61  12/26/21 117/73  12/19/21 103/64   Wt Readings from Last 3 Encounters:  07/07/22 213 lb (96.6 kg)  12/26/21 191 lb (86.6 kg)  12/19/21 189 lb (85.7 kg)      Physical Exam Constitutional:      Appearance: Normal appearance. She is obese.  HENT:     Head: Normocephalic.  Cardiovascular:     Rate and Rhythm: Normal rate and regular rhythm.  Neurological:     General: No focal deficit present.     Mental Status: She is alert and oriented to person, place, and time.  Psychiatric:        Mood and Affect: Mood normal.      Assessment & Plan:  Marland KitchenMarland KitchenEllisyn was seen today for follow-up.  Diagnoses and all orders for this visit:  Class 1 obesity due to excess calories without serious comorbidity with body mass index (BMI) of 33.0 to 33.9 in adult -     Naltrexone-buPROPion HCl ER 8-90 MG TB12; 1 tab daily for week  1, then 1 tab BID for week 2, then 2 tab PO qAM and 1 tab PO qPM for week 3, then 2 tabs BID. -     Naltrexone-buPROPion HCl ER 8-90 MG TB12; Take 2 tablets by mouth 2 (two) times daily.  Abnormal weight gain -     Naltrexone-buPROPion HCl ER 8-90 MG TB12; 1 tab daily for week 1, then 1 tab BID for week 2, then 2 tab PO qAM and 1 tab PO qPM for week 3, then 2 tabs BID. -     Naltrexone-buPROPion HCl ER 8-90 MG TB12; Take 2 tablets by mouth 2 (two) times daily.   Marland Kitchen.Discussed low carb diet with 1500 calories and 80g of protein.  Exercising at least 150 minutes a week.  [employer] could be a Chief Technology Officer.  Insurance does not cover GLP-1s and compounding pharmacy is  very hard to get to She did well on contrave in the past Restart with titration  Follow up in 4 months  Gave info on Qsymia as well.  Discussed SEs if she wanted to switch at some point.    Return in about 3 months (around 10/06/2022).    Tandy Gaw, PA-C

## 2022-08-14 ENCOUNTER — Other Ambulatory Visit: Payer: Self-pay

## 2022-08-14 DIAGNOSIS — E6609 Other obesity due to excess calories: Secondary | ICD-10-CM

## 2022-08-14 DIAGNOSIS — R635 Abnormal weight gain: Secondary | ICD-10-CM

## 2022-08-17 MED ORDER — NALTREXONE-BUPROPION HCL ER 8-90 MG PO TB12: 2.0000 | ORAL_TABLET | Freq: Two times a day (BID) | ORAL | 5 refills | Status: DC

## 2022-10-12 ENCOUNTER — Encounter: Payer: Self-pay | Admitting: Physician Assistant

## 2022-10-12 ENCOUNTER — Ambulatory Visit: Payer: BC Managed Care – PPO | Admitting: Physician Assistant

## 2022-10-12 VITALS — BP 116/79 | HR 83 | Ht 67.0 in | Wt 226.0 lb

## 2022-10-12 DIAGNOSIS — R635 Abnormal weight gain: Secondary | ICD-10-CM | POA: Diagnosis not present

## 2022-10-12 DIAGNOSIS — E6609 Other obesity due to excess calories: Secondary | ICD-10-CM

## 2022-10-12 MED ORDER — NALTREXONE-BUPROPION HCL ER 8-90 MG PO TB12: 2.0000 | ORAL_TABLET | Freq: Two times a day (BID) | ORAL | 5 refills | Status: DC

## 2022-10-12 MED ORDER — WEGOVY 0.25 MG/0.5ML ~~LOC~~ SOAJ: 0.2500 mg | SUBCUTANEOUS | 0 refills | Status: DC

## 2022-10-12 NOTE — Progress Notes (Signed)
Established Patient Office Visit  Subjective   Patient ID: Teresa Rojas, female    DOB: 17-Apr-1980  Age: 42 y.o. MRN: [id number]  Chief Complaint  Patient presents with   Follow-up    Discuss weight loss medication. Pt states it isn't helping    HPI Pt is a 42 yo female who presents to the clinic to follow up on weight. She is on contrave bid. She felt like she has had some initial improvement and even lost some weight. Recently over last 3 months she has gained 13lbs. She feels like most of the time she does not notice much difference taking contrave. She used saxenda before and had great benefit. She wonders if she could try that. She denies exercising currently.   .. 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   No energy 03/06/2016   GAD (generalized anxiety disorder) 03/06/2016   Class 1 obesity due to excess calories without serious comorbidity with body mass index (BMI) of 33.0 to 33.9 in adult 07/23/2019   Family history of colon cancer 08/05/2020   Right foot pain 12/20/2020   Localized swelling of right foot 12/20/2020   Overweight (BMI 25.0-29.9) 12/19/2021   Abnormal weight gain 07/07/2022   Resolved Ambulatory Problems    Diagnosis Date Noted   Supervision of normal first pregnancy in first trimester 02/03/2013   Rubella non-immune status, antepartum 02/07/2013   Low-lying placenta 06/01/2013   Supervision of normal first pregnancy 07/19/2013   PROM (premature rupture of membranes) 09/10/2013   Supervision of normal pregnancy, antepartum 06/03/2015   Obesity in pregnancy, antepartum 06/03/2015   Second trimester bleeding 07/02/2015   Placenta previa antepartum 08/16/2015   Choroid plexus cyst of fetus on prenatal ultrasound 08/16/2015   Placenta previa antepartum in second trimester 09/20/2015   Placenta previa with hemorrhage in second trimester 09/20/2015   Antepartum  placental abruption 10/23/2015   Placental abruption in third trimester 12/08/2015   Status post vaginal delivery 12/09/2015   BMI 37.0-37.9, adult 12/04/2016   Past Medical History:  Diagnosis Date   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    Objective:     BP 116/79 (BP Location: Right Arm, Patient Position: Sitting, Cuff Size: Large)   Pulse 83   Ht 5\' 7"  (1.702 m)   Wt 226 lb (102.5 kg)   SpO2 92%   BMI 35.40 kg/m  BP Readings from Last 3 Encounters:  10/12/22 116/79  07/07/22 102/61  12/26/21 117/73   Wt Readings from Last 3 Encounters:  10/12/22 226 lb (102.5 kg)  07/07/22 213 lb (96.6 kg)  12/26/21 191 lb (86.6 kg)      Physical Exam Constitutional:      Appearance: Normal appearance. She is obese.  Cardiovascular:     Rate and Rhythm: Normal rate and regular rhythm.  Pulmonary:     Effort: Pulmonary effort is normal.     Breath sounds: Normal breath sounds.  Musculoskeletal:     Right lower leg: No edema.     Left lower leg: No edema.  Neurological:     General: No focal deficit present.     Mental Status: She is alert and oriented to person, place, and time.  Psychiatric:        Mood and Affect: Mood normal.      The  10-year ASCVD risk score (Arnett DK, et al., 2019) is: 0.4%    Assessment & Plan:  Marland KitchenMarland KitchenOsceola was seen today for follow-up.  Diagnoses and all orders for this visit:  Class 2 obesity due to excess calories without serious comorbidity with body mass index (BMI) of 35.0 to 35.9 in adult -     WEGOVY 0.25 MG/0.5ML SOAJ; Inject 0.25 mg into the skin once a week. Use this dose for 1 month (4 shots) and then increase to next higher dose. -     Naltrexone-buPROPion HCl ER 8-90 MG TB12; Take 2 tablets by mouth 2 (two) times daily.  Abnormal weight gain -     Naltrexone-buPROPion HCl ER 8-90 MG TB12; Take 2 tablets by mouth 2 (two) times daily.  Class 1 obesity due to excess calories without serious  comorbidity with body mass index (BMI) of 33.0 to 33.9 in adult   Continue contrave if cannot get wegovy filled Strongly encouraged 150 minutes of exercise a week Discussed wegovy weekly and side effects Sent to pharmacy  Will need to titrate up Follow up in 3 months   Return in about 3 months (around 01/12/2023).    Teresa Gaw, PA-C

## 2022-10-12 NOTE — Patient Instructions (Signed)
Semaglutide Injection (Weight Management) What is this medication? SEMAGLUTIDE (SEM a GLOO tide) promotes weight loss. It may also be used to maintain weight loss. It works by decreasing appetite. Changes to diet and exercise are often combined with this medication. This medicine may be used for other purposes; ask your health care provider or pharmacist if you have questions. COMMON BRAND NAME(S): Wegovy What should I tell my care team before I take this medication? They need to know if you have any of these conditions: Endocrine tumors (MEN 2) or if someone in your family had these tumors Eye disease, vision problems Gallbladder disease History of depression or mental health disease History of pancreatitis Kidney disease Stomach or intestine problems Suicidal thoughts, plans, or attempt; a previous suicide attempt by you or a family member Thyroid cancer or if someone in your family had thyroid cancer An unusual or allergic reaction to semaglutide, other medications, foods, dyes, or preservatives Pregnant or trying to get pregnant Breast-feeding How should I use this medication? This medication is injected under the skin. You will be taught how to prepare and give it. Take it as directed on the prescription label. It is given once every week (every 7 days). Keep taking it unless your care team tells you to stop. It is important that you put your used needles and pens in a special sharps container. Do not put them in a trash can. If you do not have a sharps container, call your pharmacist or care team to get one. A special MedGuide will be given to you by the pharmacist with each prescription and refill. Be sure to read this information carefully each time. This medication comes with INSTRUCTIONS FOR USE. Ask your pharmacist for directions on how to use this medication. Read the information carefully. Talk to your pharmacist or care team if you have questions. Talk to your care team about  the use of this medication in children. While it may be prescribed for children as young as 12 years for selected conditions, precautions do apply. Overdosage: If you think you have taken too much of this medicine contact a poison control center or emergency room at once. NOTE: This medicine is only for you. Do not share this medicine with others. What if I miss a dose? If you miss a dose and the next scheduled dose is more than 2 days away, take the missed dose as soon as possible. If you miss a dose and the next scheduled dose is less than 2 days away, do not take the missed dose. Take the next dose at your regular time. Do not take double or extra doses. If you miss your dose for 2 weeks or more, take the next dose at your regular time or call your care team to talk about how to restart this medication. What may interact with this medication? Insulin and other medications for diabetes This list may not describe all possible interactions. Give your health care provider a list of all the medicines, herbs, non-prescription drugs, or dietary supplements you use. Also tell them if you smoke, drink alcohol, or use illegal drugs. Some items may interact with your medicine. What should I watch for while using this medication? Visit your care team for regular checks on your progress. It may be some time before you see the benefit from this medication. Drink plenty of fluids while taking this medication. Check with your care team if you have severe diarrhea, nausea, and vomiting, or if you sweat a   lot. The loss of too much body fluid may make it dangerous for you to take this medication. This medication may affect blood sugar levels. Ask your care team if changes in diet or medications are needed if you have diabetes. Talk to your care team if may be pregnant. Losing weight while pregnant is not advised and may cause harm to the fetus. Talk to your care team for more information. What side effects may I notice  from receiving this medication? Side effects that you should report to your care team as soon as possible: Allergic reactions--skin rash, itching, hives, swelling of the face, lips, tongue, or throat Change in vision Dehydration--increased thirst, dry mouth, feeling faint or lightheaded, headache, dark yellow or brown urine Gallbladder problems--severe stomach pain, nausea, vomiting, fever Heart palpitations--rapid, pounding, or irregular heartbeat Kidney injury--decrease in the amount of urine, swelling of the ankles, hands, or feet Pancreatitis--severe stomach pain that spreads to your back or gets worse after eating or when touched, fever, nausea, vomiting Thoughts of suicide or self-harm, worsening mood, feelings of depression Thyroid cancer--new mass or lump in the neck, pain or trouble swallowing, trouble breathing, hoarseness Side effects that usually do not require medical attention (report these to your care team if they continue or are bothersome): Diarrhea Loss of appetite Nausea Upset stomach This list may not describe all possible side effects. Call your doctor for medical advice about side effects. You may report side effects to FDA at 1-800-FDA-1088. Where should I keep my medication? Keep out of the reach of children and pets. Refrigeration (preferred): Store in the refrigerator. Do not freeze. Keep this medication in the original container until you are ready to take it. Get rid of any unused medication after the expiration date. Room temperature: If needed, prior to cap removal, the pen can be stored at room temperature for up to 28 days. Protect from light. If it is stored at room temperature, get rid of any unused medication after 28 days or after it expires, whichever is first. It is important to get rid of the medication as soon as you no longer need it or it is expired. You can do this in two ways: Take the medication to a medication take-back program. Check with your  pharmacy or law enforcement to find a location. If you cannot return the medication, follow the directions in the MedGuide. NOTE: This sheet is a summary. It may not cover all possible information. If you have questions about this medicine, talk to your doctor, pharmacist, or health care provider.  2024 Elsevier/Gold Standard (2022-05-24 00:00:00)  

## 2022-10-27 ENCOUNTER — Encounter: Payer: Self-pay | Admitting: Physician Assistant

## 2022-11-03 ENCOUNTER — Other Ambulatory Visit: Payer: Self-pay | Admitting: Physician Assistant

## 2022-11-03 ENCOUNTER — Telehealth: Payer: Self-pay

## 2022-11-03 ENCOUNTER — Encounter: Payer: Self-pay | Admitting: Physician Assistant

## 2022-11-03 DIAGNOSIS — E6609 Other obesity due to excess calories: Secondary | ICD-10-CM

## 2022-11-03 NOTE — Telephone Encounter (Addendum)
Initiated Prior authorization ZOX:WRUEAV 0.25MG /0.5ML auto-injectors Via: Covermymeds Case/Key:[id number] Status: approved  as of 11/03/22 Reason:Authorization Expiration Date: March 09, 2023. Notified Pt via: Mychart

## 2022-11-09 NOTE — Telephone Encounter (Signed)
Can you help answer wegovy questions?

## 2022-12-04 ENCOUNTER — Other Ambulatory Visit: Payer: Self-pay | Admitting: Physician Assistant

## 2022-12-04 DIAGNOSIS — E6609 Other obesity due to excess calories: Secondary | ICD-10-CM

## 2022-12-07 MED ORDER — WEGOVY 0.5 MG/0.5ML ~~LOC~~ SOAJ: 0.5000 mg | SUBCUTANEOUS | 0 refills | Status: DC

## 2022-12-24 ENCOUNTER — Other Ambulatory Visit: Payer: Self-pay | Admitting: Physician Assistant

## 2022-12-24 DIAGNOSIS — Z1231 Encounter for screening mammogram for malignant neoplasm of breast: Secondary | ICD-10-CM

## 2023-01-02 ENCOUNTER — Other Ambulatory Visit: Payer: Self-pay | Admitting: Physician Assistant

## 2023-01-05 ENCOUNTER — Other Ambulatory Visit: Payer: Self-pay | Admitting: Physician Assistant

## 2023-01-06 MED ORDER — WEGOVY 1 MG/0.5ML ~~LOC~~ SOAJ: 1.0000 mg | SUBCUTANEOUS | 0 refills | Status: DC

## 2023-01-09 ENCOUNTER — Other Ambulatory Visit: Payer: Self-pay | Admitting: Physician Assistant

## 2023-01-09 DIAGNOSIS — F411 Generalized anxiety disorder: Secondary | ICD-10-CM

## 2023-01-13 ENCOUNTER — Ambulatory Visit: Payer: BC Managed Care – PPO | Admitting: Physician Assistant

## 2023-01-13 ENCOUNTER — Encounter: Payer: Self-pay | Admitting: Physician Assistant

## 2023-01-13 VITALS — BP 123/84 | HR 89 | Ht 67.0 in | Wt 231.0 lb

## 2023-01-13 DIAGNOSIS — E66812 Obesity, class 2: Secondary | ICD-10-CM

## 2023-01-13 DIAGNOSIS — Z6836 Body mass index (BMI) 36.0-36.9, adult: Secondary | ICD-10-CM | POA: Diagnosis not present

## 2023-01-13 DIAGNOSIS — E6609 Other obesity due to excess calories: Secondary | ICD-10-CM

## 2023-01-13 MED ORDER — WEGOVY 2.4 MG/0.75ML ~~LOC~~ SOAJ: 2.4000 mg | SUBCUTANEOUS | 0 refills | Status: DC

## 2023-01-13 MED ORDER — WEGOVY 1.7 MG/0.75ML ~~LOC~~ SOAJ: 1.7000 mg | SUBCUTANEOUS | 0 refills | Status: DC

## 2023-01-13 NOTE — Progress Notes (Signed)
Established Patient Office Visit  Subjective   Patient ID: Teresa Rojas, female    DOB: 11-21-1980  Age: 42 y.o. MRN: [id number]  Chief Complaint  Patient presents with   Medical Management of Chronic Issues    Wgt managmet    HPI Pt is a 42 yo obese female who presents to the clinic to follow up on wegovy. She is on her first week of 1mg . She is tolerating great. She has not really felt it being effective until this week. She has gained 5lbs in 3 months. She admits to not exercising. She is trying to eat less and watch carbs.   . 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   No energy 03/06/2016   GAD (generalized anxiety disorder) 03/06/2016   Class 2 obesity due to excess calories without serious comorbidity with body mass index (BMI) of 36.0 to 36.9 in adult 07/23/2019   Family history of colon cancer 08/05/2020   Right foot pain 12/20/2020   Localized swelling of right foot 12/20/2020   Overweight (BMI 25.0-29.9) 12/19/2021   Abnormal weight gain 07/07/2022   Resolved Ambulatory Problems    Diagnosis Date Noted   Supervision of normal first pregnancy in first trimester 02/03/2013   Rubella non-immune status, antepartum 02/07/2013   Low-lying placenta 06/01/2013   Supervision of normal first pregnancy 07/19/2013   PROM (premature rupture of membranes) 09/10/2013   Supervision of normal pregnancy, antepartum 06/03/2015   Obesity in pregnancy, antepartum 06/03/2015   Second trimester bleeding 07/02/2015   Placenta previa antepartum 08/16/2015   Choroid plexus cyst of fetus on prenatal ultrasound 08/16/2015   Placenta previa antepartum in second trimester 09/20/2015   Placenta previa with hemorrhage in second trimester 09/20/2015   Antepartum placental abruption 10/23/2015   Placental abruption in third trimester 12/08/2015   Status post vaginal delivery 12/09/2015   BMI 37.0-37.9,  adult 12/04/2016   Past Medical History:  Diagnosis Date   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    Objective:     BP 123/84   Pulse 89   Ht 5\' 7"  (1.702 m)   Wt 231 lb (104.8 kg)   SpO2 99%   BMI 36.18 kg/m  BP Readings from Last 3 Encounters:  01/13/23 123/84  10/12/22 116/79  07/07/22 102/61   Wt Readings from Last 3 Encounters:  01/13/23 231 lb (104.8 kg)  10/12/22 226 lb (102.5 kg)  07/07/22 213 lb (96.6 kg)      Physical Exam Constitutional:      Appearance: Normal appearance. She is obese.  Cardiovascular:     Rate and Rhythm: Normal rate.  Pulmonary:     Effort: Pulmonary effort is normal.  Neurological:     General: No focal deficit present.     Mental Status: She is alert and oriented to person, place, and time.  Psychiatric:        Mood and Affect: Mood normal.      The 10-year ASCVD risk score (Arnett DK, et al., 2019) is: 0.4%    Assessment & Plan:  Marland KitchenMarland KitchenEri was seen today for medical management of chronic issues.  Diagnoses and all orders for this visit:  Class 2 obesity due to excess calories without serious comorbidity with body mass index (BMI) of 36.0 to 36.9 in adult -  WEGOVY 2.4 MG/0.75ML SOAJ; Inject 2.4 mg into the skin once a week. -     WEGOVY 1.7 MG/0.75ML SOAJ; Inject 1.7 mg into the skin once a week. Use this dose for 1 month (4 shots) and then increase to next higher dose.   Pt has just felt the medication start working at 1mg  on her first week No side effects Will continue titration up to 1.7mg  and 2.4mg  and recheck in 4 months Discussed importance of healthy diet and regular exercise at least a week  Return in about 4 months (around 05/16/2023) for weight.    Tandy Gaw, PA-C

## 2023-01-20 ENCOUNTER — Ambulatory Visit
Admission: RE | Admit: 2023-01-20 | Discharge: 2023-01-20 | Disposition: A | Discharge status: Home / Self Care | Payer: BC Managed Care – PPO | Source: Ambulatory Visit | Attending: Physician Assistant | Admitting: Physician Assistant

## 2023-01-20 DIAGNOSIS — Z1231 Encounter for screening mammogram for malignant neoplasm of breast: Secondary | ICD-10-CM | POA: Diagnosis not present

## 2023-01-22 NOTE — Progress Notes (Signed)
Normal mammogram. Follow up in 1 year.

## 2023-03-11 ENCOUNTER — Encounter: Payer: Self-pay | Admitting: Physician Assistant

## 2023-05-17 ENCOUNTER — Ambulatory Visit (INDEPENDENT_AMBULATORY_CARE_PROVIDER_SITE_OTHER): Payer: BC Managed Care – PPO | Admitting: Physician Assistant

## 2023-05-17 ENCOUNTER — Encounter: Payer: Self-pay | Admitting: Physician Assistant

## 2023-05-17 VITALS — BP 129/84 | HR 77 | Ht 67.0 in | Wt 237.0 lb

## 2023-05-17 DIAGNOSIS — E66812 Obesity, class 2: Secondary | ICD-10-CM | POA: Diagnosis not present

## 2023-05-17 DIAGNOSIS — Z6837 Body mass index (BMI) 37.0-37.9, adult: Secondary | ICD-10-CM

## 2023-05-17 DIAGNOSIS — T887XXA Unspecified adverse effect of drug or medicament, initial encounter: Secondary | ICD-10-CM | POA: Insufficient documentation

## 2023-05-17 DIAGNOSIS — E6609 Other obesity due to excess calories: Secondary | ICD-10-CM | POA: Diagnosis not present

## 2023-05-17 MED ORDER — ZEPBOUND 2.5 MG/0.5ML ~~LOC~~ SOAJ: 2.5000 mg | SUBCUTANEOUS | 0 refills | Status: DC

## 2023-05-17 MED ORDER — ZEPBOUND 5 MG/0.5ML ~~LOC~~ SOAJ: 5.0000 mg | SUBCUTANEOUS | 0 refills | Status: DC

## 2023-05-17 NOTE — Progress Notes (Signed)
 Established Patient Office Visit  Subjective   Patient ID: Teresa Rojas, female    DOB: [birth year]  Age: 43 y.o. MRN: [id number]  Chief Complaint  Patient presents with   Medical Management of Chronic Issues    Wgt management     HPI Pt is a 43 yo obese female who presents to the clinic to follow up on weight. She is on wegovy and not tolerating well despite having been on before with positive response. She has titrated up to the highest dose with no weight loss and lots of nausea, GI upset. She feels like she is pregnant on it.   She has failed contrave and wegovy now. She wonders about zepbound.   .. 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   No energy 03/06/2016   GAD (generalized anxiety disorder) 03/06/2016   Class 2 obesity due to excess calories without serious comorbidity with body mass index (BMI) of 36.0 to 36.9 in adult 07/23/2019   Family history of colon cancer 08/05/2020   Right foot pain 12/20/2020   Localized swelling of right foot 12/20/2020   Overweight (BMI 25.0-29.9) 12/19/2021   Abnormal weight gain 07/07/2022   Medication side effect 05/17/2023   Resolved Ambulatory Problems    Diagnosis Date Noted   Supervision of normal first pregnancy in first trimester 02/03/2013   Rubella non-immune status, antepartum 02/07/2013   Low-lying placenta 06/01/2013   Supervision of normal first pregnancy 07/19/2013   PROM (premature rupture of membranes) 09/10/2013   Supervision of normal pregnancy, antepartum 06/03/2015   Obesity in pregnancy, antepartum 06/03/2015   Second trimester bleeding 07/02/2015   Placenta previa antepartum 08/16/2015   Choroid plexus cyst of fetus on prenatal ultrasound 08/16/2015   Placenta previa antepartum in second trimester 09/20/2015   Placenta previa with hemorrhage in second trimester 09/20/2015   Antepartum placental abruption 10/23/2015    Placental abruption in third trimester 12/08/2015   Status post vaginal delivery 12/09/2015   BMI 37.0-37.9, adult 12/04/2016   Past Medical History:  Diagnosis Date   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    Objective:     BP 129/84   Pulse 77   Ht 5\' 7"  (1.702 m)   Wt 237 lb (107.5 kg)   SpO2 99%   BMI 37.12 kg/m  BP Readings from Last 3 Encounters:  05/17/23 129/84  01/13/23 123/84  10/12/22 116/79   Wt Readings from Last 3 Encounters:  05/17/23 237 lb (107.5 kg)  01/13/23 231 lb (104.8 kg)  10/12/22 226 lb (102.5 kg)      Physical Exam Constitutional:      Appearance: Normal appearance. She is obese.  HENT:     Head: Normocephalic.  Cardiovascular:     Rate and Rhythm: Normal rate and regular rhythm.  Pulmonary:     Effort: Pulmonary effort is normal.  Musculoskeletal:     Cervical back: Normal range of motion and neck supple.     Right lower leg: No edema.     Left lower leg: No edema.  Neurological:     General: No focal deficit present.     Mental Status: She is alert and oriented to person, place, and time.  Psychiatric:        Mood and Affect: Mood normal.  The 10-year ASCVD risk score (Arnett DK, et al., 2019) is: 0.5%    Assessment & Plan:  Marland KitchenMarland KitchenRoshini was seen today for medical management of chronic issues.  Diagnoses and all orders for this visit:  Class 2 obesity due to excess calories without serious comorbidity with body mass index (BMI) of 37.0 to 37.9 in adult -     tirzepatide (ZEPBOUND) 2.5 MG/0.5ML Pen; Inject 2.5 mg into the skin once a week. -     tirzepatide (ZEPBOUND) 5 MG/0.5ML Pen; Inject 5 mg into the skin once a week.  Medication side effect   Wegovy added to intolerance list Failed contrave Would like to try zepbound Discussed side effects and how to titrate up Encouraged protein 80g a day and 150 minutes of exercise Follow up in 3 months Consider OTC slenderix as well for  weight loss Consider bariatric referral as well to consider surgery.    Return in about 3 months (around 08/14/2023).    Tandy Gaw, PA-C

## 2023-05-17 NOTE — Patient Instructions (Addendum)
 Slenderix drops online.   150 minutes a week 80g protein   Tirzepatide Injection (Weight Management) What is this medication? TIRZEPATIDE (tir ZEP a tide) promotes weight loss. It may also be used to maintain weight loss.  It works by decreasing appetite. Changes to diet and exercise are often combined with this medication. This medicine may be used for other purposes; ask your health care provider or pharmacist if you have questions. COMMON BRAND NAME(S): Zepbound What should I tell my care team before I take this medication? They need to know if you have any of these conditions: Diabetes Eye disease caused by diabetes Gallbladder disease Have or have had depression Have or have had pancreatitis Having surgery Kidney disease Personal or family history of MEN 2, a condition that causes endocrine gland tumors Personal or family history of thyroid cancer Stomach or intestine problems, such as problems digesting food Suicidal thoughts, plans, or attempt An unusual or allergic reaction to tirzepatide, other medications, foods, dyes, or preservatives Pregnant or trying to get pregnant Breastfeeding How should I use this medication? This medication is injected under the skin. You will be taught how to prepare and give it. Take it as directed on the prescription label. Keep taking it unless your care team tells you to stop. It is important that you put your used needles and syringes in a special sharps container. Do not put them in a trash can. If you do not have a sharps container, call your pharmacist or care team to get one. A special MedGuide will be given to you by the pharmacist with each prescription and refill. Be sure to read this information carefully each time. This medication comes with INSTRUCTIONS FOR USE. Ask your pharmacist for directions on how to use this medication. Read the information carefully. Talk to your pharmacist or care team if you have questions. Talk to your  care team about the use of this medication in children. Special care may be needed. Overdosage: If you think you have taken too much of this medicine contact a poison control center or emergency room at once. NOTE: This medicine is only for you. Do not share this medicine with others. What if I miss a dose? If you miss a dose, take it as soon as you can unless it is more than 4 days (96 hours) late. If it is more than 4 days late, skip the missed dose. Take the next dose at the normal time. Do not take 2 doses within 3 days (72 hours) of each other. What may interact with this medication? Certain medications for diabetes, such as insulin, glyburide, glipizide This medication may affect how other medications work. Talk with your care team about all of the medications you take. They may suggest changes to your treatment plan to lower the risk of side effects and to make sure your medications work as intended. This list may not describe all possible interactions. Give your health care provider a list of all the medicines, herbs, non-prescription drugs, or dietary supplements you use. Also tell them if you smoke, drink alcohol, or use illegal drugs. Some items may interact with your medicine. What should I watch for while using this medication? Visit your care team for regular checks on your progress. Tell your care team if your condition does not start to get better or if it gets worse. Tell your care team if you are taking medication to treat diabetes, such as insulin or glipizide. This may increase your risk of  low blood sugar. Know the symptoms of low blood sugar and how to treat it. Talk to your care team about your risk of cancer. You may be more at risk for certain types of cancer if you take this medication. Talk to your care team right away if you have a lump or swelling in your neck, hoarseness that does not go away, trouble swallowing, shortness of breath, or trouble breathing. Make sure you stay  hydrated while taking this medication. Drink water often. Eat fruits and veggies that have a high water content. Drink more water when it is hot or you are active. Talk to your care team right away if you have fever, infection, vomiting, diarrhea, or if you sweat a lot while taking this medication. The loss of too much body fluid may make it dangerous for you to take this medication. If you are going to need surgery or a procedure, tell your care team that you are taking this medication. Estrogen and progestin hormones that you take by mouth may not work as well while you are taking this medication. Switch to a non-oral contraceptive or add a barrier contraceptive for 4 weeks after starting this medication and after each dose increase. Talk to your care team about contraceptive options. They can help you find the option that works for you. What side effects may I notice from receiving this medication? Side effects that you should report to your care team as soon as possible: Allergic reactions or angioedema--skin rash, itching or hives, swelling of the face, eyes, lips, tongue, arms, or legs, trouble swallowing or breathing Bowel blockage--stomach cramping, unable to have a bowel movement or pass gas, loss of appetite, vomiting Change in vision Dehydration--increased thirst, dry mouth, feeling faint or lightheaded, headache, dark yellow or brown urine Gallbladder problems--severe stomach pain, nausea, vomiting, fever Kidney injury--decrease in the amount of urine, swelling of the ankles, hands, or feet Pancreatitis--severe stomach pain that spreads to your back or gets worse after eating or when touched, fever, nausea, vomiting Thoughts of suicide or self-harm, worsening mood, feelings of depression Thyroid cancer--new mass or lump in the neck, pain or trouble swallowing, trouble breathing, hoarseness Side effects that usually do not require medical attention (report these to your care team if they  continue or are bothersome): Diarrhea Loss of appetite Nausea Upset stomach This list may not describe all possible side effects. Call your doctor for medical advice about side effects. You may report side effects to FDA at 1-800-FDA-1088. Where should I keep my medication? Keep out of the reach of children and pets. Store in a refrigerator or at room temperature up to 30 degrees C (86 degrees F). Keep it in the original container. Protect from light. Refrigeration (preferred): Store in the refrigerator. Do not freeze. Get rid of any unused medication after the expiration date. Room temperature: This medication may be stored at room temperature for up to 21 days. If it is stored at room temperature, get rid of any unused medication after 21 days or after it expires, whichever is first. To get rid of medications that are no longer needed or have expired: Take the medication to a medication take-back program. Check with your pharmacy or law enforcement to find a location. If you cannot return the medication, ask your pharmacist or care team how to get rid of this medication safely. NOTE: This sheet is a summary. It may not cover all possible information. If you have questions about this medicine, talk to your  doctor, pharmacist, or health care provider.  2024 Elsevier/Gold Standard (2023-02-26 00:00:00)

## 2023-06-13 ENCOUNTER — Other Ambulatory Visit: Payer: Self-pay | Admitting: Physician Assistant

## 2023-06-13 DIAGNOSIS — E6609 Other obesity due to excess calories: Secondary | ICD-10-CM

## 2023-06-18 NOTE — Telephone Encounter (Signed)
 Prior auth for: ZEPBOUND Determination: PENDING Auth #: [id number] Valid from: N/A Patient notified via MyChart

## 2023-07-02 NOTE — Telephone Encounter (Signed)
 Zepbound approved. Authorization Expiration Date: October 22, 2023.

## 2023-08-16 ENCOUNTER — Ambulatory Visit: Payer: BC Managed Care – PPO | Admitting: Physician Assistant

## 2023-10-25 ENCOUNTER — Other Ambulatory Visit (HOSPITAL_COMMUNITY): Payer: Self-pay

## 2023-10-25 ENCOUNTER — Telehealth: Payer: Self-pay

## 2023-10-25 NOTE — Telephone Encounter (Signed)
 Mychart sent to pt to have her contact the office to schedule follow up appt.

## 2023-10-25 NOTE — Telephone Encounter (Signed)
 Pharmacy Patient Advocate Encounter   Received notification from CoverMyMeds that prior authorization for Zepbound  is due for renewal.   Insurance verification completed.   The patient is insured through [facility].   Patient hasn't been seen in your office in over 4 months. Plan requires updated chart notes for PA renewal.

## 2024-02-11 ENCOUNTER — Ambulatory Visit: Admitting: Physician Assistant

## 2024-02-11 ENCOUNTER — Encounter: Payer: Self-pay | Admitting: Physician Assistant

## 2024-02-11 VITALS — BP 138/70 | HR 70 | Ht 67.0 in

## 2024-02-11 DIAGNOSIS — F411 Generalized anxiety disorder: Secondary | ICD-10-CM | POA: Diagnosis not present

## 2024-02-11 DIAGNOSIS — K644 Residual hemorrhoidal skin tags: Secondary | ICD-10-CM

## 2024-02-11 DIAGNOSIS — K6289 Other specified diseases of anus and rectum: Secondary | ICD-10-CM

## 2024-02-11 DIAGNOSIS — Z800 Family history of malignant neoplasm of digestive organs: Secondary | ICD-10-CM | POA: Diagnosis not present

## 2024-02-11 MED ORDER — CITALOPRAM HYDROBROMIDE 40 MG PO TABS: 40.0000 mg | ORAL_TABLET | Freq: Every day | ORAL | 3 refills | Status: AC

## 2024-02-11 NOTE — Progress Notes (Signed)
 Acute Office Visit  Subjective:     Patient ID: Teresa Rojas, female    DOB: 1981-03-16, 43 y.o.   MRN: [id number]   HPI Discussed the use of AI scribe software for clinical note transcription with the patient, who gave verbal consent to proceed.  History of Present Illness Teresa Rojas is a 43 year old female who presents with rectal discomfort.   Rectal pain - Rectal pain present for the past couple of months - Described as a dull soreness, primarily noticed when sitting at work - Pain does not radiate - No associated rectal bleeding - Pain is not severe enough to prevent sitting - Palpable 'little squishy' area in the rectal region, suspected to be a hemorrhoid  Bowel habits - Bowel movements generally normal and soft - Occasional small, pebble-like stools - No blood in stools - No black, tarry, or sticky stools - No abdominal pain  Family history of colorectal malignancy - Mother had rectal cancer - No prior colonoscopy performed  GAD - Anxiety controlled on celexa , needs refills    ROS See HPI.     Objective:    BP 138/70   Pulse 70   Ht 5' 7 (1.702 m)   SpO2 99%   BMI 37.12 kg/m  BP Readings from Last 3 Encounters:  02/11/24 138/70  05/17/23 129/84  01/13/23 123/84   Wt Readings from Last 3 Encounters:  05/17/23 237 lb (107.5 kg)  01/13/23 231 lb (104.8 kg)  10/12/22 226 lb (102.5 kg)    ..    02/14/2024    7:34 AM 12/26/2021   11:10 AM 12/19/2021   11:16 AM 12/20/2019   10:56 AM  GAD 7 : Generalized Anxiety Score  Nervous, Anxious, on Edge 1 0 1 2  Control/stop worrying 0 0 0 1  Worry too much - different things 0 0 0 2  Trouble relaxing 0 0 1 2  Restless 0 0 0 2  Easily annoyed or irritable 0 0 0 2  Afraid - awful might happen 0 0 0 0  Total GAD 7 Score 1 0 2 11  Anxiety Difficulty Not difficult at all Not difficult at all Not difficult at all Somewhat difficult      Physical Exam Constitutional:      Appearance: Normal  appearance.  HENT:     Head: Normocephalic.  Cardiovascular:     Rate and Rhythm: Normal rate and regular rhythm.  Pulmonary:     Effort: Pulmonary effort is normal.  Abdominal:     General: There is no distension.     Palpations: Abdomen is soft.     Tenderness: There is no abdominal tenderness. There is no guarding or rebound.  Genitourinary:    Comments: 6 o'clock around anus evidence of residual hemorrhoid skin tag, no erythema and not tender to palpation.  Neurological:     General: No focal deficit present.     Mental Status: She is oriented to person, place, and time.  Psychiatric:        Mood and Affect: Mood normal.          Assessment & Plan:  SABRASABRAEricca was seen today for medical management of chronic issues.  Diagnoses and all orders for this visit:  Rectum pain  GAD (generalized anxiety disorder) -     citalopram  (CELEXA ) 40 MG tablet; Take 1 tablet (40 mg total) by mouth daily.  Family history of rectal cancer  Residual hemorrhoidal skin tags  Assessment & Plan Hemorrhoid with skin tag, collapsed, without bleeding Collapsed hemorrhoid with skin tag, benign and common finding. - Reassured regarding the benign nature of the collapsed hemorrhoid with skin tag. - Discussed hemorrhoid care with prep H OTC and sitz baths.  - Follow up with any bowel movement changes, bleeding or worsening pain.   Family history of malignant neoplasm of digestive organs Family history of rectal cancer in mother, no current symptoms or concerning findings. - Provided reassurance regarding the absence of concerning findings on examination. - Screening starts at 42 yo.   GAD GAD to goal - refilled celexa      Vermell Bologna, PA-C

## 2024-02-11 NOTE — Patient Instructions (Signed)
 Hemorrhoids Hemorrhoids are swollen veins in and around the rectum or the opening of the butt (anus). There are two types of hemorrhoids: Internal. These occur in the veins just inside the rectum. They may poke through to the outside and become irritated and painful. External. These occur in the veins outside the anus. They can be felt as a painful swelling or hard lump near the anus. Most hemorrhoids do not cause severe problems. Often, they can be treated at home with diet and lifestyle changes. If home treatments do not help, you may need a procedure to shrink or remove the hemorrhoids. What are the causes? Hemorrhoids are caused by pressure near the anus. This pressure may be caused by: Constipation or diarrhea. Straining to poop. Pregnancy. Obesity. Sitting or riding a bike for a long time. Heavy lifting or other things that cause you to strain. Anal sex. What are the signs or symptoms? Symptoms of this condition include: Pain. Anal itching or irritation. Bleeding from the rectum. Leakage of poop (stool). Swelling of the anus. One or more lumps around the anus. How is this diagnosed? Hemorrhoids can often be diagnosed through a visual exam. Other exams or tests may also be done, such as: A digital rectal exam. This is when your health care provider feels inside your rectum with a gloved finger. Anoscope. This is an exam of the anus using a small tube. A blood test, if you have lost a lot of blood. A sigmoidoscopy or colonoscopy. These are tests to look inside the colon using a tube with a camera on the end. How is this treated? In most cases, hemorrhoids can be treated at home with diet and lifestyle changes. If these changes do not help, you may need to have a procedure done. These procedures can make the hemorrhoids smaller or fully remove them. Common procedures include: Rubber band ligation. Rubber bands are placed at the base of the hemorrhoids to cut off their blood  supply. Sclerotherapy. Medicine is put into the hemorrhoids to shrink them. Infrared coagulation. A type of light energy is used to get rid of the hemorrhoids. Hemorrhoidectomy surgery. The hemorrhoids are removed during surgery. Then, the veins that supply them are tied off. Stapled hemorrhoidopexy surgery. The base of the hemorrhoid is stapled to the wall of the rectum. Follow these instructions at home: Medicines Take over-the-counter and prescription medicines only as told by your provider. Use medicated creams or medicines that are put in the rectum (suppositories) as told by your provider. Eating and drinking  Eat foods that are high in fiber, such as beans, whole grains, and fresh fruits and vegetables. Ask your provider about taking products that have fiber added to them (fiber supplements). Reduce the amount of fat in your diet. You can do this by eating low-fat dairy products, eating less red meat, and avoiding processed foods. Drink enough fluid to keep your pee (urine) pale yellow. Managing pain and swelling  Take warm sitz baths for 20 minutes, 3-4 times a day. This can help ease pain and discomfort. You may do this in a bathtub or you can use a portable sitz bath that fits over the toilet. If told, put ice on the affected area. It may help to use ice packs between sitz baths. Put ice in a plastic bag. Place a towel between your skin and the bag. Leave the ice on for 20 minutes, 2-3 times a day. If your skin turns bright red, remove the ice right away to prevent  skin damage. The risk of damage is higher if you cannot feel pain, heat, or cold. General instructions Exercise. Ask your provider how much and what kind of exercise is best for you. In general, you should do moderate exercise for at least 30 minutes on most days of the week (150 minutes each week). You may want to try walking, biking, or yoga. Go to the bathroom when you have the urge to poop. Do not wait. Avoid  straining to poop. Keep the anus dry and clean. Use wet toilet paper or moist towelettes after you poop. Do not sit on the toilet for a long time. This can increase blood pooling and pain. Where to find more information [employer] of Diabetes and Digestive and Kidney Diseases: [email or web address] Contact a health care provider if: You have more pain and swelling that do not get better with treatment. You have trouble pooping or you are not able to poop. You have pain or inflammation outside the area of the hemorrhoids. Get help right away if: You are bleeding from your rectum and you cannot get it to stop. This information is not intended to replace advice given to you by your health care provider. Make sure you discuss any questions you have with your health care provider. Document Revised: 11/26/2021 Document Reviewed: 11/26/2021 Elsevier Patient Education  2024 [employer].

## 2024-02-14 ENCOUNTER — Encounter: Payer: Self-pay | Admitting: Physician Assistant

## 2024-02-28 DIAGNOSIS — D235 Other benign neoplasm of skin of trunk: Secondary | ICD-10-CM | POA: Diagnosis not present

## 2024-04-04 ENCOUNTER — Other Ambulatory Visit: Payer: Self-pay | Admitting: Physician Assistant

## 2024-04-04 DIAGNOSIS — Z1231 Encounter for screening mammogram for malignant neoplasm of breast: Secondary | ICD-10-CM

## 2024-05-04 ENCOUNTER — Encounter

## 2024-05-04 DIAGNOSIS — Z1231 Encounter for screening mammogram for malignant neoplasm of breast: Secondary | ICD-10-CM

## 2024-06-09 ENCOUNTER — Encounter
